# Patient Record
Sex: Female | Born: 1990 | Hispanic: Yes | Marital: Married | Smoking: Never smoker
Health system: Southern US, Community
[De-identification: ages and names within clinical notes are randomized; demographics above are authoritative.]

## PROBLEM LIST (undated history)

## (undated) DIAGNOSIS — Z3490 Encounter for supervision of normal pregnancy, unspecified, unspecified trimester: Principal | ICD-10-CM

## (undated) DIAGNOSIS — Z319 Encounter for procreative management, unspecified: Principal | ICD-10-CM

## (undated) DIAGNOSIS — Z789 Other specified health status: Secondary | ICD-10-CM

## (undated) DIAGNOSIS — Z1331 Encounter for screening for depression: Secondary | ICD-10-CM

## (undated) DIAGNOSIS — N912 Amenorrhea, unspecified: Principal | ICD-10-CM

## (undated) DIAGNOSIS — O019 Hydatidiform mole, unspecified: Secondary | ICD-10-CM

## (undated) DIAGNOSIS — Z309 Encounter for contraceptive management, unspecified: Secondary | ICD-10-CM

## (undated) DIAGNOSIS — Z8759 Personal history of other complications of pregnancy, childbirth and the puerperium: Secondary | ICD-10-CM

## (undated) HISTORY — DX: Amenorrhea, unspecified: N91.2

## (undated) HISTORY — DX: Encounter for procreative management, unspecified: Z31.9

## (undated) HISTORY — DX: Hydatidiform mole, unspecified: O01.9

## (undated) HISTORY — DX: Encounter for contraceptive management, unspecified: Z30.9

## (undated) HISTORY — DX: Encounter for screening for depression: Z13.31

## (undated) HISTORY — PX: NO PAST SURGERIES: SHX2092

## (undated) HISTORY — DX: Encounter for supervision of normal pregnancy, unspecified, unspecified trimester: Z34.90

## (undated) HISTORY — DX: Personal history of other complications of pregnancy, childbirth and the puerperium: Z87.59

---

## 2013-01-31 ENCOUNTER — Encounter: Payer: Self-pay | Admitting: Adult Health

## 2013-03-22 ENCOUNTER — Encounter: Payer: Self-pay | Admitting: *Deleted

## 2013-03-25 ENCOUNTER — Ambulatory Visit (INDEPENDENT_AMBULATORY_CARE_PROVIDER_SITE_OTHER): Payer: 59 | Admitting: Adult Health

## 2013-03-25 ENCOUNTER — Encounter: Payer: Self-pay | Admitting: Adult Health

## 2013-03-25 VITALS — BP 110/60 | Ht 63.0 in | Wt 115.0 lb

## 2013-03-25 DIAGNOSIS — N912 Amenorrhea, unspecified: Secondary | ICD-10-CM

## 2013-03-25 DIAGNOSIS — Z3202 Encounter for pregnancy test, result negative: Secondary | ICD-10-CM

## 2013-03-25 DIAGNOSIS — Z3200 Encounter for pregnancy test, result unknown: Secondary | ICD-10-CM

## 2013-03-25 HISTORY — DX: Amenorrhea, unspecified: N91.2

## 2013-03-25 MED ORDER — MEDROXYPROGESTERONE ACETATE 10 MG PO TABS: 10.0000 mg | ORAL_TABLET | Freq: Every day | ORAL | Status: DC

## 2013-03-25 MED ORDER — PRENATAL PLUS 27-1 MG PO TABS: 1.0000 | ORAL_TABLET | Freq: Every day | ORAL | Status: DC

## 2013-03-25 NOTE — Patient Instructions (Addendum)
Secondary Amenorrhea  Secondary amenorrhea is the stopping of menstrual flow for 3 to 6 months in a female who has previously had periods. There are many possible causes. Most of these causes are not serious. Usually treating the underlying problem causing the loss of menses will return your periods to normal. CAUSES  Some common and uncommon causes of not menstruating include:  Malnutrition.  Low blood sugar (hypoglycemia).  Polycystic ovarian disease.  Stress or fear.  Breastfeeding.  Hormone imbalance.  Ovarian failure.  Medications.  Extreme obesity.  Cystic fibrosis.  Low body weight or drastic weight reduction from any cause.  Early menopause.  Removal of ovaries or uterus.  Contraceptives.  Illness.  Long term (chronic) illnesses.  Cushing's syndrome.  Thyroid problems.  Birth control pills, patches, or vaginal rings for birth control. DIAGNOSIS  This diagnosis is made by your caregiver taking a medical history and doing a physical exam. Pregnancy must be ruled out. Often times, numerous blood tests of different hormones in the body may be measured. Urine testing may be done. Specialized x-rays may have to be done as well as measuring the body mass index (BMI). TREATMENT  Treatment depends on the cause of the amenorrhea. If an eating disorder is present, this can be treated with an adequate diet and therapy. Chronic illnesses may improve with treatment of the illness. Overall, the outlook is good. The amenorrhea may be corrected with medications, lifestyle changes, or surgery. If the amenorrhea cannot be corrected, it is sometimes possible to create a false menstruation with medications. Document Released: 10/24/2006 Document Revised: 12/05/2011 Document Reviewed: 08/31/2007 [facility] Patient Information 2014 [place], [place]. Take provera 10 mg x 10 days  Follow up in 21 days Take prenatal  vitamins

## 2013-03-25 NOTE — Progress Notes (Signed)
Subjective:     Patient ID: Maria [name], female   DOB: 1991-04-20, 22 y.o.   MRN: [id number]  HPI [name] is a 22 year old Hispanic female in complaining of no period x 3 months, it was normal til April.She stopped the pill in June of 2012.She and her husband want a baby.  Review of Systems Positives in HPI  Reviewed past medical,surgical, social and family history. Reviewed medications and allergies.     Objective:   Physical Exam BP 110/60  Ht 5\' 3"  (1.6 m)  Wt 115 lb (52.164 kg)  BMI 20.38 kg/m2  LMP 03/17/2013  Urine pregnancy test negative.Skin warm and dry. Neck: mid line trachea, normal thyroid. Discussed starting period with provera and if she does not start will get labs.     Assessment:     Amenorrhea Desires pregnancy    Plan:      Rx provera 10 mg #30 1 po x 10 days, no refills Rx prenatal plus #30 1 daily  Follow up  in 3 weeks, to see if she starts and will discuss options then

## 2013-04-15 ENCOUNTER — Encounter: Payer: Self-pay | Admitting: Adult Health

## 2013-04-15 ENCOUNTER — Ambulatory Visit (INDEPENDENT_AMBULATORY_CARE_PROVIDER_SITE_OTHER): Payer: 59 | Admitting: Adult Health

## 2013-04-15 VITALS — BP 120/76 | Ht 63.0 in | Wt 115.0 lb

## 2013-04-15 DIAGNOSIS — Z319 Encounter for procreative management, unspecified: Secondary | ICD-10-CM

## 2013-04-15 HISTORY — DX: Encounter for procreative management, unspecified: Z31.9

## 2013-04-15 NOTE — Assessment & Plan Note (Signed)
Period started after provera have sex every other night and check progesterone level 04/29/13

## 2013-04-15 NOTE — Progress Notes (Signed)
Subjective:     Patient ID: Maria [name], female   DOB: Jun 17, 1991, 22 y.o.   MRN: [id number]  HPI Maria [name] is back to discuss getting pregnant, her period started July 15 after taking provera.  Review of Systems See HPI Reviewed past medical,surgical, social and family history. Reviewed medications and allergies.     Objective:   Physical Exam BP 120/76  Ht 5\' 3"  (1.6 m)  Wt 115 lb (52.164 kg)  BMI 20.38 kg/m2  LMP 04/09/2013   talk only, to have sex every other night til 8/6 and come in 04/29/13 to get progesterone level drawn, continue prenatal vitamins. Husband with her.  Assessment:      Desires pregnancy    Plan:      Return August 4 for progesterone level Have sex every other night til 8/6 Take prenatal vitamins  Call any problems or questions

## 2013-04-15 NOTE — Patient Instructions (Addendum)
Return august 4 for labs Follow up labs by phone

## 2013-04-29 ENCOUNTER — Other Ambulatory Visit (INDEPENDENT_AMBULATORY_CARE_PROVIDER_SITE_OTHER): Payer: 59

## 2013-04-29 DIAGNOSIS — Z319 Encounter for procreative management, unspecified: Secondary | ICD-10-CM

## 2013-04-29 DIAGNOSIS — N912 Amenorrhea, unspecified: Secondary | ICD-10-CM

## 2013-04-30 ENCOUNTER — Telehealth: Payer: Self-pay | Admitting: Adult Health

## 2013-04-30 NOTE — Telephone Encounter (Signed)
Left message to call back regarding progesterone level

## 2014-08-20 ENCOUNTER — Other Ambulatory Visit: Payer: Self-pay | Admitting: Women's Health

## 2014-08-20 ENCOUNTER — Encounter: Payer: Self-pay | Admitting: Women's Health

## 2014-08-20 ENCOUNTER — Ambulatory Visit (INDEPENDENT_AMBULATORY_CARE_PROVIDER_SITE_OTHER): Payer: BC Managed Care – PPO | Admitting: Women's Health

## 2014-08-20 ENCOUNTER — Other Ambulatory Visit (HOSPITAL_COMMUNITY)
Admission: RE | Admit: 2014-08-20 | Discharge: 2014-08-20 | Disposition: A | Discharge status: Home / Self Care | Payer: BC Managed Care – PPO | Source: Ambulatory Visit | Attending: Obstetrics & Gynecology | Admitting: Obstetrics & Gynecology

## 2014-08-20 VITALS — BP 140/66 | Ht 60.5 in | Wt 109.0 lb

## 2014-08-20 DIAGNOSIS — N912 Amenorrhea, unspecified: Secondary | ICD-10-CM

## 2014-08-20 DIAGNOSIS — R52 Pain, unspecified: Secondary | ICD-10-CM

## 2014-08-20 DIAGNOSIS — N644 Mastodynia: Secondary | ICD-10-CM | POA: Insufficient documentation

## 2014-08-20 DIAGNOSIS — Z01419 Encounter for gynecological examination (general) (routine) without abnormal findings: Secondary | ICD-10-CM | POA: Insufficient documentation

## 2014-08-20 DIAGNOSIS — Z803 Family history of malignant neoplasm of breast: Secondary | ICD-10-CM

## 2014-08-20 DIAGNOSIS — Z3202 Encounter for pregnancy test, result negative: Secondary | ICD-10-CM

## 2014-08-20 DIAGNOSIS — N970 Female infertility associated with anovulation: Secondary | ICD-10-CM | POA: Insufficient documentation

## 2014-08-20 LAB — POCT URINE PREGNANCY: Preg Test, Ur: NEGATIVE

## 2014-08-20 MED ORDER — CLOMIPHENE CITRATE 50 MG PO TABS: 50.0000 mg | ORAL_TABLET | Freq: Every day | ORAL | Status: DC

## 2014-08-20 MED ORDER — MEDROXYPROGESTERONE ACETATE 10 MG PO TABS: 10.0000 mg | ORAL_TABLET | Freq: Every day | ORAL | Status: DC

## 2014-08-20 MED ORDER — CONCEPT DHA 53.5-38-1 MG PO CAPS: 1.0000 | ORAL_CAPSULE | Freq: Every day | ORAL | Status: DC

## 2014-08-20 NOTE — Patient Instructions (Signed)
Take Provera daily for 10 days then stop, you should have some bleeding when you stop (the first day you see blood is considered Day 1)- if you do not- then call us and let us know Take Clomid on days 3-7, then stop Have sex every other day on days 7-24 Pee before sex, then lay with your hips propped up on pillows for 20-30 minutes after sex Begin taking a prenatal vitamin

## 2014-08-20 NOTE — Progress Notes (Signed)
Patient ID: Maria [name], female   DOB: 03/21/1991, 23 y.o.   MRN: [id number]  Subjective:   Maria [name] is a 23 y.o. G0P0 Hispanic female here for a routine well-woman exam.  Patient's last menstrual period was 04/05/2014.    Current complaints: Very irregular periods- last period in July, trying to get pregnant unsuccessfully for ~1.1534yrs, saw JAG 02/2013 for same, was given provera and did have a period, progesterone was checked and suggested pt did not ovulate, JAG was going to offer clomid, but pt did not return call.  Lt breast pain x 1 month, constant at first, now just intermittent, under Lt axilla down left side of left breast and some on Rt side of left breast. Mom was dx w/ breast CA at 36yo.  No h/o HTN, states she's just a little nervous about being here PCP: Robbie [place], has appt in Dec for physical       Does desire labs  Social History: Sexual: heterosexual Marital Status: married Living situation: with spouse Tobacco/alcohol: no etoh or tobacco Illicit drugs: no history of illicit drug use  The following portions of the patient's history were reviewed and updated as appropriate: allergies, current medications, past family history, past medical history, past social history, past surgical history and problem list.  Past Medical History Past Medical History  Diagnosis Date  . Amenorrhea 03/25/2013  . Patient desires pregnancy 04/15/2013    Past Surgical History History reviewed. No pertinent past surgical history.  Gynecologic History No obstetric history on file.  Patient's last menstrual period was 04/05/2014. Contraception: none, trying to conceive Last Pap: unsure. Results were: normal Last mammogram: never. Results were: n/a Last TCS: never  Obstetric History OB History  No data available    Current Medications Current Outpatient Prescriptions on File Prior to Visit  Medication Sig Dispense Refill  . Multiple Vitamins-Minerals (MULTIVITAMIN WITH MINERALS)  tablet Take 1 tablet by mouth daily.    . prenatal vitamin w/FE, FA (PRENATAL 1 + 1) 27-1 MG TABS Take 1 tablet by mouth daily at 12 noon. (Patient not taking: Reported on 08/20/2014) 30 each 0   No current facility-administered medications on file prior to visit.    Review of Systems Patient denies any headaches, blurred vision, shortness of breath, chest pain, abdominal pain, problems with bowel movements, urination, or intercourse.  Objective:  BP 140/66 mmHg  Ht 5' 0.5" (1.537 m)  Wt 109 lb (49.442 kg)  BMI 20.93 kg/m2  LMP 04/05/2014 Physical Exam  General:  Well developed, well nourished, no acute distress. She is alert and oriented x3. Skin:  Warm and dry Neck:  Midline trachea, no thyromegaly or nodules Cardiovascular: Regular rate and rhythm, no murmur heard Lungs:  Effort normal, all lung fields clear to auscultation bilaterally Breasts:  No dominant palpable mass, retraction, or nipple discharge Abdomen:  Soft, non tender, no hepatosplenomegaly or masses Pelvic:  External genitalia is normal in appearance.  The vagina is normal in appearance. The cervix is bulbous, no CMT.  Thin prep pap is done w/ reflex HR HPV cotesting. Uterus is felt to be normal size, shape, and contour.  No adnexal masses or tenderness noted. Extremities:  No swelling or varicosities noted Psych:  She has a normal mood and affect  Assessment:   Healthy well-woman exam Infertility r/t anovulation Lt breast mastalgia H/O mom dx w/ breast CA @ 35yo  Plan:  Will get breast u/s (spoke to radiology, will not do mammo 1st <30yo): scheduled for 12/1 @ 2pm @  [facility] Rx concept dha pnv to begin daily Rx provera 10mg  take 1 daily x 10d, let us know if no period/bleeding when stops Begin clomid 50mg  daily on days 3-7 Have sex QOD on days 7-24 Pee before sex, lay w/ hips elevated x 20-4130mins after sex before getting up F/U 3 months for f/u, or sooner if needed Colonoscopy @ 24yo or sooner if  problems  Marge DuncansBooker, Keaghan Staton Randall CNM, [facility] 08/20/2014 4:03 PM

## 2014-08-21 LAB — COMPREHENSIVE METABOLIC PANEL
ALBUMIN: 4.5 g/dL (ref 3.5–5.2)
ALK PHOS: 56 U/L (ref 39–117)
ALT: 16 U/L (ref 0–35)
AST: 20 U/L (ref 0–37)
BILIRUBIN TOTAL: 0.5 mg/dL (ref 0.2–1.2)
BUN: 8 mg/dL (ref 6–23)
CO2: 23 mEq/L (ref 19–32)
CREATININE: 0.6 mg/dL (ref 0.50–1.10)
Calcium: 9.4 mg/dL (ref 8.4–10.5)
Chloride: 103 mEq/L (ref 96–112)
GLUCOSE: 93 mg/dL (ref 70–99)
Potassium: 3.9 mEq/L (ref 3.5–5.3)
Sodium: 140 mEq/L (ref 135–145)
Total Protein: 7.4 g/dL (ref 6.0–8.3)

## 2014-08-21 LAB — CBC
HEMATOCRIT: 40 % (ref 36.0–46.0)
Hemoglobin: 13.8 g/dL (ref 12.0–15.0)
MCH: 29.7 pg (ref 26.0–34.0)
MCHC: 34.5 g/dL (ref 30.0–36.0)
MCV: 86.2 fL (ref 78.0–100.0)
MPV: 9.5 fL (ref 9.4–12.4)
PLATELETS: 328 10*3/uL (ref 150–400)
RBC: 4.64 MIL/uL (ref 3.87–5.11)
RDW: 13.5 % (ref 11.5–15.5)
WBC: 6.6 10*3/uL (ref 4.0–10.5)

## 2014-08-21 LAB — TSH: TSH: 1.38 u[IU]/mL (ref 0.350–4.500)

## 2014-08-25 ENCOUNTER — Other Ambulatory Visit: Payer: Self-pay | Admitting: Women's Health

## 2014-08-25 ENCOUNTER — Telehealth: Payer: Self-pay | Admitting: Women's Health

## 2014-08-25 DIAGNOSIS — N644 Mastodynia: Secondary | ICD-10-CM

## 2014-08-25 NOTE — Telephone Encounter (Signed)
Notified pt of breast u/s appt at [facility] tomorrow 12/1 @ 1400, be there at 1345, no deoderant/lotion/powder.  Cheral MarkerKimberly R. Cady Hafen, CNM, [facility]NP-BC 08/25/2014 2:14 PM

## 2014-08-26 ENCOUNTER — Ambulatory Visit (HOSPITAL_COMMUNITY)
Admission: RE | Admit: 2014-08-26 | Discharge: 2014-08-26 | Disposition: A | Discharge status: Home / Self Care | Payer: BC Managed Care – PPO | Source: Ambulatory Visit | Attending: Women's Health | Admitting: Women's Health

## 2014-08-26 ENCOUNTER — Other Ambulatory Visit: Payer: Self-pay | Admitting: Women's Health

## 2014-08-26 DIAGNOSIS — N644 Mastodynia: Secondary | ICD-10-CM | POA: Diagnosis not present

## 2014-08-26 DIAGNOSIS — Z803 Family history of malignant neoplasm of breast: Secondary | ICD-10-CM | POA: Insufficient documentation

## 2014-08-26 DIAGNOSIS — R52 Pain, unspecified: Secondary | ICD-10-CM

## 2014-08-26 LAB — CYTOLOGY - PAP

## 2014-09-11 ENCOUNTER — Telehealth: Payer: Self-pay | Admitting: Adult Health

## 2014-09-11 NOTE — Telephone Encounter (Signed)
Pt states she has taken clomid from day 3-7 and is now bleeding heavily, is she suppose to have sex while bleeding heavily. Per Cyril MourningJennifer Griffin, NP, she can wait 3-4 days if more comfortable but is ok and safe to have sex now. Pt verbalized understanding.

## 2014-10-14 ENCOUNTER — Telehealth: Payer: Self-pay | Admitting: Women's Health

## 2014-10-14 NOTE — Telephone Encounter (Signed)
Spoke with pt. Pt states she had no period for Jan. She has had a negative pregnancy test. She was prescribed Clomid in November. Please advise. Thanks!! JSY

## 2014-10-14 NOTE — Telephone Encounter (Signed)
Left message x 1. JSY 

## 2014-10-14 NOTE — Telephone Encounter (Signed)
Maria BattenKim and [name] spoke with pt. Pt to start Clomid today x 5 days and have sex every other day when she stops Clomid. To have Progesterone level on 2/9. JSY

## 2014-11-04 ENCOUNTER — Other Ambulatory Visit: Payer: BLUE CROSS/BLUE SHIELD

## 2014-11-04 DIAGNOSIS — N970 Female infertility associated with anovulation: Secondary | ICD-10-CM

## 2014-11-05 ENCOUNTER — Other Ambulatory Visit: Payer: Self-pay | Admitting: Women's Health

## 2014-11-05 ENCOUNTER — Telehealth: Payer: Self-pay | Admitting: Women's Health

## 2014-11-05 DIAGNOSIS — Z319 Encounter for procreative management, unspecified: Secondary | ICD-10-CM

## 2014-11-05 LAB — PROGESTERONE: Progesterone: 0.8 ng/mL

## 2014-11-05 NOTE — Telephone Encounter (Signed)
Called to notify pt of progesterone 0.8 on 2/9. Had called on 1/19 and stated she hadn't had period for January- so recommended beginning the clomid x 5d then and come in 21d later for progesterone- which was on 2/9. Pt states she did end up having a period from 1/26-2/1 and another round of clomid on days 3-7, so really should be ovulating around now. Discussed w/ JAG. Will recheck progesterone 21d after start of last period, will be on 2/16. If progesterone not suggestive of ovulation, will increase clomid to 100mg . Used [name] as an interpreter w/ pt's permission.  Cheral MarkerKimberly R. Booker, CNM, [facility] 11/05/2014 1:58 PM

## 2014-11-11 ENCOUNTER — Other Ambulatory Visit: Payer: BLUE CROSS/BLUE SHIELD

## 2014-11-11 DIAGNOSIS — Z319 Encounter for procreative management, unspecified: Secondary | ICD-10-CM

## 2014-11-12 LAB — PROGESTERONE: Progesterone: 1.7 ng/mL

## 2014-11-17 ENCOUNTER — Telehealth: Payer: Self-pay | Admitting: Women's Health

## 2014-11-17 NOTE — Telephone Encounter (Signed)
Left message to return call, to discuss progesterone results.  Cheral MarkerKimberly R. Yeily Link, CNM, [facility]NP-BC 11/17/2014 4:57 PM

## 2014-11-18 ENCOUNTER — Telehealth: Payer: Self-pay | Admitting: Women's Health

## 2014-11-18 NOTE — Telephone Encounter (Signed)
Pt returned call, notified her she did not ovulate, has been on clomid 50mg  x 3 cycles now- to increase clomid to 100mg  on days 3-7 x 3 months.  To call back in May when period starts if she is not pregnant before then, will recheck progesterone level then on day 21. Understands that increases in clomid can increase r/f multiple gestation- she is ok w/ this. Used [name] for interpreter with pt's permission.  Cheral MarkerKimberly R. Manju Kulkarni, CNM, [facility]NP[facility]BC 11/18/2014 5:07 PM

## 2014-11-19 ENCOUNTER — Ambulatory Visit: Payer: BC Managed Care – PPO | Admitting: Women's Health

## 2014-11-21 ENCOUNTER — Other Ambulatory Visit: Payer: Self-pay | Admitting: *Deleted

## 2014-11-22 ENCOUNTER — Other Ambulatory Visit: Payer: Self-pay | Admitting: Women's Health

## 2014-11-24 ENCOUNTER — Ambulatory Visit: Payer: BC Managed Care – PPO | Admitting: Women's Health

## 2014-11-24 ENCOUNTER — Telehealth: Payer: Self-pay | Admitting: Women's Health

## 2014-11-24 ENCOUNTER — Other Ambulatory Visit: Payer: Self-pay | Admitting: Adult Health

## 2014-11-24 MED ORDER — CLOMIPHENE CITRATE 50 MG PO TABS: ORAL_TABLET | ORAL | Status: DC

## 2014-11-24 NOTE — Telephone Encounter (Signed)
Pt requesting refill on Clomid 100 mg for infertility. Pt states Joellyn HaffKim Booker, CNM increase Clomid from 50 to 100 mg for the next 3 mos but pt does not have any refills.

## 2014-11-24 NOTE — Telephone Encounter (Signed)
Refilled clomid 

## 2014-11-28 NOTE — Telephone Encounter (Signed)
Kim refilled the Clomid per pt request.

## 2015-01-14 ENCOUNTER — Telehealth: Payer: Self-pay | Admitting: Women's Health

## 2015-01-14 MED ORDER — CLOMIPHENE CITRATE 50 MG PO TABS: ORAL_TABLET | ORAL | Status: DC

## 2015-01-15 ENCOUNTER — Other Ambulatory Visit: Payer: Self-pay | Admitting: Women's Health

## 2015-02-02 ENCOUNTER — Ambulatory Visit (INDEPENDENT_AMBULATORY_CARE_PROVIDER_SITE_OTHER): Payer: BLUE CROSS/BLUE SHIELD | Admitting: Obstetrics and Gynecology

## 2015-02-02 ENCOUNTER — Encounter: Payer: Self-pay | Admitting: Obstetrics and Gynecology

## 2015-02-02 VITALS — BP 130/80 | Ht 63.0 in | Wt 112.0 lb

## 2015-02-02 DIAGNOSIS — N926 Irregular menstruation, unspecified: Secondary | ICD-10-CM

## 2015-02-02 DIAGNOSIS — N970 Female infertility associated with anovulation: Secondary | ICD-10-CM

## 2015-02-02 MED ORDER — CLOMIPHENE CITRATE 50 MG PO TABS: 200.0000 mg | ORAL_TABLET | Freq: Every day | ORAL | Status: DC

## 2015-02-02 NOTE — Progress Notes (Signed)
Patient ID: Maria [name], female   DOB: 03/21/1991, 24 y.o.   MRN: [id number]    [facility]  Patient name: Maria [name] MRN [id number]  Date of birth: 03/21/1991  CC & HPI:  Maria [name] is a 24 y.o. female with a history of amenorrhea presenting today for difficulty conceiving. Pt reports that she has been trying to become pregnant for over two years with no success. She has a history of irregular menstrual cycles, but reports that regularity improves with Clomid. Pt takes Clomid-100 mg starting on day 3 of her cycle. She reports normal cycles of 21-27 days.  ROS:  A complete 10 system review of systems was obtained and all systems are negative except as noted in the HPI and PMH.   Pertinent History Reviewed:   Reviewed: Significant for amenorrhea Medical         Past Medical History  Diagnosis Date  . Amenorrhea 03/25/2013  . Patient desires pregnancy 04/15/2013                              Surgical Hx:   History reviewed. No pertinent past surgical history. Medications: Reviewed & Updated - see associated section                       Current outpatient prescriptions:  .  Prenat-FeFum-FePo-FA-Omega 3 (CONCEPT DHA) 53.5-38-1 MG CAPS, Take 1 capsule by mouth daily., Disp: 30 capsule, Rfl: 11 .  clomiPHENE (CLOMID) 50 MG tablet, Take 2 tabs days 3-7 of cycle (Patient not taking: Reported on 02/02/2015), Disp: 6 tablet, Rfl: 0 .  medroxyPROGESTERone (PROVERA) 10 MG tablet, Take 1 tablet (10 mg total) by mouth daily. (Patient not taking: Reported on 02/02/2015), Disp: 10 tablet, Rfl: 3   Social History: Reviewed -  reports that she has never smoked. She has never used smokeless tobacco.  Objective Findings:  Vitals: Blood pressure 130/80, height 5\' 3"  (1.6 m), weight 112 lb (50.803 kg).  Physical Examination: General appearance - alert, well appearing, and in no distress and oriented to person, place, and time Mental status - alert, oriented to person, place, and time,  normal mood, behavior, speech, dress, motor activity, and thought processes  Discussion only   Assessment & Plan:   A:  1. History of amenorrhea 2. Difficulty conceiving 3. Pt takes Clomid-100 mg every day starting 3rd day of cycle  P:  1. Measure progesterone level in 2 days 2. Will follow-up with plan, including possible progesterone suppository, pending Progesterone levels on 5/11 3. Will prescribe Clomid-200 mg for 5 days. Pt needs help with copay. She will continue taking 100 mg/d  4. Follow-up in 10 days during pt's menses   This chart was scribed for Tilda BurrowJohn Gayatri Teasdale V, MD by Gwenyth Oberatherine Macek, ED Scribe. This patient was seen in conference room and the patient's care was started at 4:52 PM.   I personally performed the services described in this documentation, which was SCRIBED in my presence. The recorded information has been reviewed and considered accurate. It has been edited as necessary during review. Tilda BurrowFERGUSON,Marielis Samara V, MD

## 2015-02-02 NOTE — Progress Notes (Signed)
Patient ID: Maria [name], female   DOB: 03/21/1991, 24 y.o.   MRN: [id number] Pt here today for follow up from her last visit. Pt denies any problems or concerns at this time.

## 2015-02-04 ENCOUNTER — Other Ambulatory Visit: Payer: BLUE CROSS/BLUE SHIELD

## 2015-02-04 ENCOUNTER — Other Ambulatory Visit: Payer: Self-pay | Admitting: Obstetrics and Gynecology

## 2015-02-05 LAB — PROGESTERONE: PROGESTERONE: 22.3 ng/mL

## 2015-02-06 ENCOUNTER — Telehealth: Payer: Self-pay | Admitting: Obstetrics and Gynecology

## 2015-02-06 ENCOUNTER — Telehealth: Payer: Self-pay | Admitting: *Deleted

## 2015-02-06 NOTE — Telephone Encounter (Signed)
Pt progesterone levels ovulatory,  Left message for pt ; she does not need to take progesterone tabs.

## 2015-02-06 NOTE — Telephone Encounter (Signed)
Pt aware of results. Pt was also advised to have sex and call us back[place] if she has a period next month and do not take the clomid right now per ColgateJennifer Griffin. Pt verbalized understanding.

## 2015-02-12 ENCOUNTER — Ambulatory Visit (INDEPENDENT_AMBULATORY_CARE_PROVIDER_SITE_OTHER): Payer: BLUE CROSS/BLUE SHIELD | Admitting: Obstetrics and Gynecology

## 2015-02-12 ENCOUNTER — Encounter: Payer: Self-pay | Admitting: Obstetrics and Gynecology

## 2015-02-12 VITALS — BP 110/60 | Wt 111.5 lb

## 2015-02-12 DIAGNOSIS — N926 Irregular menstruation, unspecified: Secondary | ICD-10-CM

## 2015-02-12 DIAGNOSIS — N970 Female infertility associated with anovulation: Secondary | ICD-10-CM

## 2015-02-12 NOTE — Progress Notes (Signed)
Patient ID: Maria [name], female   DOB: 03/21/1991, 24 y.o.   MRN: [id number] Pt here today for follow up visit.

## 2015-02-12 NOTE — Progress Notes (Signed)
Patient ID: Maria EvenerDulce Buskirk, female   DOB: 03/21/1991, 24 y.o.   MRN: [id number]   [facility]  Patient name: Maria [name] MRN [id number]  Date of birth: 03/21/1991  CC & HPI:  Maria [name] is a 24 y.o. female presenting today for follow up of infertility associated with anovulation. She states she has not had a period this month; her menses are 1 day late.  ROS:  A complete review of systems was obtained and all systems are negative except as noted in the HPI and PMH.    Pertinent History Reviewed:   Reviewed: Significant for infertility associated  Medical         Past Medical History  Diagnosis Date  . Amenorrhea 03/25/2013  . Patient desires pregnancy 04/15/2013                              Surgical Hx:   History reviewed. No pertinent past surgical history. Medications: Reviewed & Updated - see associated section                       Current outpatient prescriptions:  .  clomiPHENE (CLOMID) 50 MG tablet, Take 4 tablets (200 mg total) by mouth daily. Take  4 tabs days 3-7 of cycle, Disp: 20 tablet, Rfl: 0 .  medroxyPROGESTERone (PROVERA) 10 MG tablet, Take 1 tablet (10 mg total) by mouth daily. (Patient not taking: Reported on 02/02/2015), Disp: 10 tablet, Rfl: 3 .  Prenat-FeFum-FePo-FA-Omega 3 (CONCEPT DHA) 53.5-38-1 MG CAPS, Take 1 capsule by mouth daily., Disp: 30 capsule, Rfl: 11   Social History: Reviewed -  reports that she has never smoked. She has never used smokeless tobacco.  Objective Findings:  Vitals: Blood pressure 110/60, weight 111 lb 8 oz (50.576 kg).  Physical Examination:  Patient here for discussion only.  Now on day 29. No menses yet.  Assessment & Plan:   A:  1. Pt's labs show that she is ovulating.  P:  1. Instructed pt take a home pregnancy test in 4 days. 2. If she does have a period, continue Clomid 100 mg days 3-7 3. F/u in 1 month.   This chart was SCRIBED for Christin BachJohn Tyshana Nishida, MD by Ronney LionSuzanne Le, ED Scribe. This patient was seen in  my office, and the patient's care was started at 4:12 PM.   I personally performed the services described in this documentation, which was SCRIBED in my presence. The recorded information has been reviewed and considered accurate. It has been edited as necessary during review. Tilda BurrowFERGUSON,Dacotah Cabello V, MD

## 2015-04-01 ENCOUNTER — Other Ambulatory Visit: Payer: Self-pay | Admitting: Obstetrics and Gynecology

## 2015-05-05 ENCOUNTER — Telehealth: Payer: Self-pay | Admitting: *Deleted

## 2015-05-11 NOTE — Telephone Encounter (Signed)
appt

## 2015-06-09 ENCOUNTER — Ambulatory Visit (INDEPENDENT_AMBULATORY_CARE_PROVIDER_SITE_OTHER): Payer: BLUE CROSS/BLUE SHIELD | Admitting: Obstetrics and Gynecology

## 2015-06-09 ENCOUNTER — Encounter: Payer: Self-pay | Admitting: Obstetrics and Gynecology

## 2015-06-09 VITALS — BP 120/76 | Ht 63.0 in | Wt 113.0 lb

## 2015-06-09 DIAGNOSIS — N970 Female infertility associated with anovulation: Secondary | ICD-10-CM | POA: Diagnosis not present

## 2015-06-09 MED ORDER — CLOMIPHENE CITRATE 50 MG PO TABS: ORAL_TABLET | ORAL | Status: DC

## 2015-06-09 NOTE — Progress Notes (Signed)
Patient ID: Maria [name], female   DOB: Dec 12, 1990, 24 y.o.   MRN: [id number]   [facility] Visit  Patient name: Maria [name] MRN [id number]  Date of birth: Feb 09, 1991  CC & HPI:  Maria [name] is a 24 y.o. female presenting today for followup infertility. lmp 9/12 light x 1 day on cycle day 36 , none today.pt took clomid on days 3-7 of cycle, on 10-14 August.. PMP is July 13-18, and pt took pills on 15-19 July. Pt is using a fertility app, also a calendar. Pt NOT taking ovulation guide but fertility app is guiding coitus.   ROS:  Not taking temperature.   Pertinent History Reviewed:   Reviewed: Significant for negative ROS Medical         Past Medical History  Diagnosis Date  . Amenorrhea 03/25/2013  . Patient desires pregnancy 04/15/2013                              Surgical Hx:   History reviewed. No pertinent past surgical history. Medications: Reviewed & Updated - see associated section                       Current outpatient prescriptions:  .  Prenat-FeFum-FePo-FA-Omega 3 (CONCEPT DHA) 53.5-38-1 MG CAPS, Take 1 capsule by mouth daily., Disp: 30 capsule, Rfl: 11 .  clomiPHENE (CLOMID) 50 MG tablet, TAKE 4 TABLETS BY MOUTH ON DAYS 3 TO 7 OF CYCLE. (Patient not taking: Reported on 06/09/2015), Disp: 20 tablet, Rfl: 0 .  medroxyPROGESTERone (PROVERA) 10 MG tablet, Take 1 tablet (10 mg total) by mouth daily. (Patient not taking: Reported on 02/02/2015), Disp: 10 tablet, Rfl: 3   Social History: Reviewed -  reports that she has never smoked. She has never used smokeless tobacco.  Objective Findings:  Vitals: Blood pressure 120/76, height  (1.6 m), weight 113 lb (51.256 kg).  Physical Examination: General appearance - alert, well appearing, and in no distress, oriented to person, place, and time and normal appearing weight Mental status - alert, oriented to person, place, and time, normal mood, behavior, speech, dress, motor activity, and thought processes Eyes -  pupils equal and reactive, extraocular eye movements intact   Assessment & Plan:   A:  1. INfertility. 2.   P:  1. Renew clomid 100 mg days 3-7. 2.

## 2015-06-09 NOTE — Progress Notes (Signed)
Patient ID: Maria [name], female   DOB: 22-May-1991, 24 y.o.   MRN: [id number] Pt here today for follow up. Pt not taking clomid at this time.

## 2015-06-15 ENCOUNTER — Ambulatory Visit (INDEPENDENT_AMBULATORY_CARE_PROVIDER_SITE_OTHER): Payer: BLUE CROSS/BLUE SHIELD | Admitting: Adult Health

## 2015-06-15 ENCOUNTER — Encounter: Payer: Self-pay | Admitting: Adult Health

## 2015-06-15 VITALS — BP 130/60 | HR 116 | Ht 63.0 in | Wt 111.5 lb

## 2015-06-15 DIAGNOSIS — O3680X0 Pregnancy with inconclusive fetal viability, not applicable or unspecified: Secondary | ICD-10-CM

## 2015-06-15 DIAGNOSIS — Z3201 Encounter for pregnancy test, result positive: Secondary | ICD-10-CM | POA: Diagnosis not present

## 2015-06-15 DIAGNOSIS — N925 Other specified irregular menstruation: Secondary | ICD-10-CM

## 2015-06-15 DIAGNOSIS — Z3490 Encounter for supervision of normal pregnancy, unspecified, unspecified trimester: Secondary | ICD-10-CM

## 2015-06-15 HISTORY — DX: Encounter for supervision of normal pregnancy, unspecified, unspecified trimester: Z34.90

## 2015-06-15 LAB — POCT URINE PREGNANCY: Preg Test, Ur: POSITIVE — AB

## 2015-06-15 NOTE — Progress Notes (Signed)
Subjective:     Patient ID: Maria [name], female   DOB: 07-16-1991, 24 y.o.   MRN: [id number]  HPI Maria [name] is a 24 year old Hispanic female, married in for UPT, has missed period, has breast tenderness and pees more often, has wanted to be pregnant for a while now and had tired clomid.  Review of Systems Patient denies any headaches, hearing loss, fatigue, blurred vision, shortness of breath, chest pain, abdominal pain, problems with bowel movements, urination, or intercourse. No joint pain or mood swings.See HPI for positives.  Reviewed past medical,surgical, social and family history. Reviewed medications and allergies.     Objective:   Physical Exam BP 130/60 mmHg  Pulse 116  Ht  (1.6 m)  Wt 111 lb 8 oz (50.576 kg)  BMI 19.76 kg/m2  LMP 05/04/2015 UPT +, about 6 weeks by LMP with EDD 02/08/16, Skin warm and dry. Neck: mid line trachea, normal thyroid, good ROM, no lymphadenopathy noted. Lungs: clear to ausculation bilaterally. Cardiovascular: regular rate and rhythm.    Assessment:     Pregnant     Plan:     Check QHCG and progesterone Return in 1 week for dating [place]   Review handout on first trimester

## 2015-06-15 NOTE — Patient Instructions (Signed)
Primer trimestre de Media planner (First Trimester of Pregnancy) El primer trimestre de Media planner se extiende desde la semana1 hasta el final de la semana12 (mes1 al mes3). Una semana despus de que un espermatozoide fecunda un vulo, este se implantar en la pared uterina. Este embrin comenzar a Medical laboratory scientific officer convertirse en un beb. Sus genes y los de su pareja forman el beb. Los genes del varn determinan si ser un nio o una nia. Entre la semana6 y [place], se forman los ojos y [place], y los latidos del corazn pueden verse en la ecografa. Al final de las 12semanas, todos los rganos del beb estn formados.  Ahora que est embarazada, querr hacer todo lo que est a su alcance para tener un beb sano. Dos de las cosas ms importantes son Lucilla Edin buena atencin prenatal y seguir las indicaciones del mdico. La atencin prenatal incluye toda la asistencia mdica que usted recibe antes del nacimiento del beb. Esta ayudar a prevenir, Hydrographic surveyor y tratar cualquier problema durante el embarazo y [place]. CAMBIOS EN EL ORGANISMO Su organismo atraviesa por muchos cambios durante el [place], y estos varan de Ardelia Mems mujer a Theatre manager.   Al principio, puede aumentar o bajar algunos kilos.  Puede tener Higher education careers adviser (nuseas) y vomitar. Si no puede controlar los vmitos, llame al mdico.  Puede cansarse con facilidad.  Es posible que tenga dolores de cabeza que pueden aliviarse con los medicamentos que el mdico le permita tomar.  Puede orinar con mayor frecuencia. El dolor al orinar puede significar que usted tiene una infeccin de la vejiga.  Debido al Glennis Brink, puede tener acidez estomacal.  Puede estar estreida, ya que ciertas hormonas enlentecen los movimientos de los msculos que [employer] desechos a travs de los intestinos.  Pueden aparecer hemorroides o abultarse e hincharse las venas (venas varicosas).  Las [employer] pueden empezar a Engineer, site y Scientist, forensic. Los pezones  pueden sobresalir ms, y el tejido que los rodea (areola) tornarse ms oscuro.  Las Production manager y estar sensibles al cepillado y al hilo dental.  Pueden aparecer zonas oscuras o manchas (cloasma, mscara del Media planner) en el rostro que probablemente se atenuarn despus del nacimiento del beb.  Los perodos menstruales se interrumpirn.  Tal vez no tenga apetito.  Puede sentir un fuerte deseo de consumir ciertos alimentos.  Puede tener cambios a Engineer, site a da, por ejemplo, por momentos puede estar emocionada por el Media planner y por otros preocuparse porque algo pueda salir mal con el embarazo o el beb.  Tendr sueos ms vvidos y extraos.  Tal vez haya cambios en el cabello que pueden incluir su engrosamiento, crecimiento rpido y cambios en la textura. A algunas mujeres tambin se les cae el cabello durante o despus del [place], o tienen el cabello seco o fino. Lo ms probable es que el cabello se le normalice despus del nacimiento del beb. QU DEBE ESPERAR EN LAS CONSULTAS PRENATALES Durante una visita prenatal de rutina:  La pesarn para asegurarse de que usted y el beb estn creciendo normalmente.  Le controlarn la presin arterial.  Le medirn el abdomen para controlar el desarrollo del beb.  Se escucharn los latidos cardacos a partir de la semana10 o la12 de embarazo, aproximadamente.  Se analizarn los resultados de los estudios solicitados en visitas anteriores. El mdico puede preguntarle:  Cmo se siente.  Si siente los movimientos del beb.  Si ha tenido [employer], como prdida de lquido, [place], dolores de cabeza intensos o  clicos abdominales.  Si tiene alguna pregunta. Otros estudios que pueden realizarse durante el primer trimestre incluyen lo siguiente:  Anlisis de sangre para determinar el tipo de sangre y detectar la presencia de infecciones previas. Adems, se los usar para controlar si los niveles de hierro  son bajos (anemia) y determinar los anticuerpos Rh. En una etapa ms avanzada del embarazo, se harn anlisis de sangre para saber si tiene diabetes, junto con otros estudios si surgen problemas.  Anlisis de orina para detectar infecciones, diabetes o protenas en la orina.  Una ecografa para confirmar que el beb crece y se desarrolla correctamente.  Una amniocentesis para diagnosticar posibles problemas genticos.  Estudios del feto para descartar espina bfida y sndrome de [place].  Es posible que necesite otras pruebas adicionales. INSTRUCCIONES PARA EL CUIDADO EN EL HOGAR  Medicamentos:  Siga las indicaciones del mdico en relacin con el uso de medicamentos. Durante el embarazo, hay medicamentos que pueden tomarse y otros que no.  Tome las vitaminas prenatales como se le indic.  Si est estreida, tome un laxante suave, si el mdico lo autoriza. Dieta  Consuma alimentos balanceados. Elija alimentos variados, como carne o protenas de origen vegetal, pescado, leche y productos lcteos descremados, verduras, frutas y panes y cereales integrales. El mdico la ayudar a determinar la cantidad de peso que puede aumentar.  No coma carne cruda ni quesos sin cocinar. Estos elementos contienen bacterias que pueden causar defectos congnitos en el beb.  La ingesta diaria de cuatro o cinco comidas pequeas en lugar de tres comidas abundantes puede ayudar a aliviar las nuseas y los vmitos. Si empieza a tener nuseas, comer algunas galletas saladas puede ser de ayuda. Beber lquidos entre las comidas en lugar de tomarlos durante las comidas tambin puede ayudar a calmar las nuseas y los vmitos.  Si est estreida, consuma alimentos con alto contenido de fibra, como verduras y frutas frescas, y cereales integrales. Beba suficiente lquido para mantener la orina clara o de color amarillo plido. Actividad y ejercicios  Haga ejercicio solamente como se lo haya indicado el mdico. El  ejercicio la ayudar a:  Controlar el peso.  Mantenerse en forma.  Estar preparada para el trabajo de parto y el parto.  Los dolores, los clicos en la parte baja del abdomen o los calambres en la cintura son un buen indicio de que debe dejar de hacer ejercicios. Consulte al mdico antes de seguir haciendo ejercicios normales.  Intente no estar de pie durante mucho tiempo. Mueva las piernas con frecuencia si debe estar de pie en un lugar durante mucho tiempo.  Evite levantar pesos excesivos.  Use zapatos de tacones bajos y mantenga una buena postura.  Puede seguir teniendo relaciones sexuales, excepto que el mdico le indique lo contrario. Alivio del dolor o las molestias  Use un sostn que le brinde buen soporte si siente dolor a la palpacin en las mamas.  Dese baos de asiento con agua tibia para aliviar el dolor o las molestias causadas por las hemorroides. Use crema antihemorroidal si el mdico se lo permite.  Descanse con las piernas elevadas si tiene calambres o dolor de cintura.  Si tiene venas varicosas en las piernas, use medias de descanso. Eleve los pies durante 15minutos, 3 o 4veces por da. Limite la cantidad de sal en su dieta. Cuidados prenatales  Programe las visitas prenatales para la semana12 de embarazo. Generalmente se programan cada mes al principio y se hacen ms frecuentes en los 2 ltimos meses antes   del parto.  Escriba sus preguntas. Llvelas cuando concurra a las visitas prenatales.  Concurra a todas las visitas prenatales como se lo haya indicado el mdico. Seguridad  Colquese el cinturn de seguridad cuando conduzca.  Haga una lista de los nmeros de telfono de Associate Professor, que [employer] nmeros de telfono de familiares, [place], el hospital y los departamentos de polica y bomberos. Consejos generales  Pdale al mdico que la derive a clases de educacin prenatal en su localidad. Debe comenzar a tomar las clases antes de Cytogeneticist en el mes6  de embarazo.  Pida ayuda si tiene necesidades nutricionales o de asesoramiento Academic librarian. El mdico puede aconsejarla o derivarla a especialistas para que la ayuden con diferentes necesidades.  No se d baos de inmersin en agua caliente, baos turcos ni saunas.  No se haga duchas vaginales ni use tampones o toallas higinicas perfumadas.  No mantenga las piernas cruzadas durante [place].  Evite el contacto con las bandejas sanitarias de los gatos y la tierra que estos animales usan. Estos elementos contienen bacterias que pueden causar defectos congnitos al beb y la posible prdida del feto debido a un aborto espontneo o muerte fetal.  No fume, no consuma hierbas ni medicamentos que no hayan sido recetados por el mdico. Las sustancias qumicas que estos productos contienen afectan la formacin y el desarrollo del beb.  Programe una cita con el dentista. En su casa, lvese los dientes con un cepillo dental blando y psese el hilo dental con suavidad. SOLICITE ATENCIN MDICA SI:   Tiene mareos.  Siente clicos leves, presin en la pelvis o dolor persistente en el abdomen.  Tiene nuseas, vmitos o diarrea persistentes.  Tiene secrecin vaginal con mal olor.  Siente dolor al [employer].  Tiene el rostro, las [place], las piernas o los tobillos ms hinchados. SOLICITE ATENCIN MDICA DE INMEDIATO SI:   Tiene fiebre.  Tiene una prdida de lquido por la vagina.  Tiene sangrado o pequeas prdidas vaginales.  Siente dolor intenso o clicos en el abdomen.  Sube o baja de peso rpidamente.  Vomita sangre de color rojo brillante o material que parezca granos de caf.  Ha estado expuesta a la rubola y no ha sufrido la enfermedad.  Ha estado expuesta a la quinta enfermedad o a la varicela.  Tiene un dolor de cabeza intenso.  Le falta el aire.  Sufre cualquier tipo de traumatismo, por ejemplo, debido a una cada o un accidente automovilstico. Document  Released: 06/22/2005 Document Revised: 01/27/2014 [facility] Patient Information 2015 [place], [place]. This information is not intended to replace advice given to you by your health care provider. Make sure you discuss any questions you have with your health care provider. Return in 1 week for [place]

## 2015-06-16 ENCOUNTER — Telehealth: Payer: Self-pay | Admitting: Adult Health

## 2015-06-16 ENCOUNTER — Ambulatory Visit: Payer: BLUE CROSS/BLUE SHIELD | Admitting: Obstetrics and Gynecology

## 2015-06-16 LAB — BETA HCG QUANT (REF LAB): HCG QUANT: 13284 m[IU]/mL

## 2015-06-16 LAB — PROGESTERONE: PROGESTERONE: 30.1 ng/mL

## 2015-06-16 NOTE — Telephone Encounter (Signed)
Maria [name] aware of labs,they are good keep appt next week for [place]

## 2015-06-23 ENCOUNTER — Ambulatory Visit (INDEPENDENT_AMBULATORY_CARE_PROVIDER_SITE_OTHER): Payer: BLUE CROSS/BLUE SHIELD

## 2015-06-23 DIAGNOSIS — O3680X0 Pregnancy with inconclusive fetal viability, not applicable or unspecified: Secondary | ICD-10-CM | POA: Diagnosis not present

## 2015-06-23 NOTE — Progress Notes (Signed)
[place] 7+1wks IUP w/YS,crl 6.18mm,pos fht 141 bpm,normal ov's bilat

## 2015-06-25 ENCOUNTER — Telehealth: Payer: Self-pay | Admitting: Obstetrics & Gynecology

## 2015-07-01 ENCOUNTER — Encounter: Payer: Self-pay | Admitting: Advanced Practice Midwife

## 2015-07-01 ENCOUNTER — Ambulatory Visit (INDEPENDENT_AMBULATORY_CARE_PROVIDER_SITE_OTHER): Payer: BLUE CROSS/BLUE SHIELD | Admitting: Advanced Practice Midwife

## 2015-07-01 VITALS — BP 120/60 | HR 80 | Wt 113.4 lb

## 2015-07-01 DIAGNOSIS — Z369 Encounter for antenatal screening, unspecified: Secondary | ICD-10-CM

## 2015-07-01 DIAGNOSIS — Z1389 Encounter for screening for other disorder: Secondary | ICD-10-CM

## 2015-07-01 DIAGNOSIS — Z331 Pregnant state, incidental: Secondary | ICD-10-CM

## 2015-07-01 DIAGNOSIS — Z3401 Encounter for supervision of normal first pregnancy, first trimester: Secondary | ICD-10-CM | POA: Diagnosis not present

## 2015-07-01 DIAGNOSIS — Z0283 Encounter for blood-alcohol and blood-drug test: Secondary | ICD-10-CM

## 2015-07-01 LAB — POCT URINALYSIS DIPSTICK
Blood, UA: NEGATIVE
Glucose, UA: NEGATIVE
KETONES UA: NEGATIVE
Leukocytes, UA: NEGATIVE
Nitrite, UA: NEGATIVE
PROTEIN UA: NEGATIVE

## 2015-07-01 NOTE — Patient Instructions (Signed)

## 2015-07-01 NOTE — Progress Notes (Signed)
  Subjective:    Maria [name] is a G1P0 [redacted]w[redacted]d being seen today for her first obstetrical visit.  THis is her first pregnancy. Patient reports some mild cramping/back ache, heartburn after eating spicy food. Ceasar Mons Vitals:   07/01/15 1558  BP: 120/60  Pulse: 80  Weight: 113 lb 6.4 oz (51.438 kg)    HISTORY: OB History  Gravida Para Term Preterm AB SAB TAB Ectopic Multiple Living  1             # Outcome Date GA Lbr Len/2nd Weight Sex Delivery Anes PTL Lv  1 Current              Past Medical History  Diagnosis Date  . Amenorrhea 03/25/2013  . Patient desires pregnancy 04/15/2013  . Pregnant 06/15/2015   History reviewed. No pertinent past surgical history. Family History  Problem Relation Age of Onset  . Cancer Mother 40    breast     Exam                                      System:     Skin: normal coloration and turgor, no rashes    Neurologic: oriented, normal, normal mood   Extremities: normal strength, tone, and muscle mass   HEENT PERRLA   Mouth/Teeth mucous membranes moist, normal dentition   Neck supple and no masses   Cardiovascular: regular rate and rhythm   Respiratory:  appears well, vitals normal, no respiratory distress, acyanotic   Abdomen: soft, non-tender;  FHR: 160 [place]          Assessment:    Pregnancy: G1P0 Patient Active Problem List   Diagnosis Date Noted  . Pregnant 06/15/2015  . 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 Family history of breast cancer in mother 08/20/2014  . Mastalgia in female 08/20/2014  . Patient desires pregnancy 04/15/2013  . Amenorrhea 03/25/2013        Plan:    Used language line Initial labs drawn. Continue prenatal vitamins  Problem list reviewed and updated  Reviewed n/v relief measures and warning s/s to report  Reviewed recommended weight gain based on pre-gravid BMI  Encouraged well-balanced diet Genetic Screening discussed Integrated Screen: declined.  Ultrasound  discussed; fetal survey: requested.  Follow up in 4 weeks for LROBU.  CRESENZO-DISHMAN,Rashaad Hallstrom 07/01/2015

## 2015-07-02 LAB — GC/CHLAMYDIA PROBE AMP
Chlamydia trachomatis, NAA: NEGATIVE
NEISSERIA GONORRHOEAE BY PCR: NEGATIVE

## 2015-07-03 LAB — SICKLE CELL SCREEN: SICKLE CELL SCREEN: NEGATIVE

## 2015-07-03 LAB — URINALYSIS, ROUTINE W REFLEX MICROSCOPIC
BILIRUBIN UA: NEGATIVE
Glucose, UA: NEGATIVE
Ketones, UA: NEGATIVE
Nitrite, UA: NEGATIVE
PH UA: 6.5 (ref 5.0–7.5)
Protein, UA: NEGATIVE
RBC UA: NEGATIVE
Specific Gravity, UA: 1.012 (ref 1.005–1.030)
Urobilinogen, Ur: 1 mg/dL (ref 0.2–1.0)

## 2015-07-03 LAB — CBC
Hematocrit: 40.6 % (ref 34.0–46.6)
Hemoglobin: 13.4 g/dL (ref 11.1–15.9)
MCH: 29.7 pg (ref 26.6–33.0)
MCHC: 33 g/dL (ref 31.5–35.7)
MCV: 90 fL (ref 79–97)
Platelets: 346 10*3/uL (ref 150–379)
RBC: 4.51 x10E6/uL (ref 3.77–5.28)
RDW: 13.4 % (ref 12.3–15.4)
WBC: 11.4 10*3/uL — AB (ref 3.4–10.8)

## 2015-07-03 LAB — PMP SCREEN PROFILE (10S), URINE
Amphetamine Screen, Ur: NEGATIVE ng/mL
Barbiturate Screen, Ur: NEGATIVE ng/mL
Benzodiazepine Screen, Urine: NEGATIVE ng/mL
CANNABINOIDS UR QL SCN: NEGATIVE ng/mL
Cocaine(Metab.)Screen, Urine: NEGATIVE ng/mL
Creatinine(Crt), U: 54.4 mg/dL (ref 20.0–300.0)
Methadone Scn, Ur: NEGATIVE ng/mL
OPIATE SCRN UR: NEGATIVE ng/mL
OXYCODONE+OXYMORPHONE UR QL SCN: NEGATIVE ng/mL
PCP Scrn, Ur: NEGATIVE ng/mL
Ph of Urine: 6.5 (ref 4.5–8.9)
Propoxyphene, Screen: NEGATIVE ng/mL

## 2015-07-03 LAB — HEPATITIS B SURFACE ANTIGEN: HEP B S AG: NEGATIVE

## 2015-07-03 LAB — URINE CULTURE: Organism ID, Bacteria: NO GROWTH

## 2015-07-03 LAB — MICROSCOPIC EXAMINATION
Casts: NONE SEEN /lpf
Epithelial Cells (non renal): NONE SEEN /hpf (ref 0–10)

## 2015-07-03 LAB — ANTIBODY SCREEN: Antibody Screen: NEGATIVE

## 2015-07-03 LAB — ABO/RH: RH TYPE: POSITIVE

## 2015-07-03 LAB — VARICELLA ZOSTER ANTIBODY, IGG: Varicella zoster IgG: >4000 index (ref >165)

## 2015-07-03 LAB — HIV ANTIBODY (ROUTINE TESTING W REFLEX): HIV Screen 4th Generation wRfx: NONREACTIVE

## 2015-07-03 LAB — RPR: RPR Ser Ql: NONREACTIVE

## 2015-07-03 LAB — RUBELLA SCREEN: Rubella Antibodies, IGG: 10.6 index (ref >0.99)

## 2015-07-14 ENCOUNTER — Telehealth: Payer: Self-pay | Admitting: Advanced Practice Midwife

## 2015-07-14 NOTE — Telephone Encounter (Signed)
Pt informed all the labs from 07/01/2015 WNL per Cathie BeamsFran Cresenzo-Dishmon, CNM.

## 2015-07-15 ENCOUNTER — Ambulatory Visit (INDEPENDENT_AMBULATORY_CARE_PROVIDER_SITE_OTHER): Payer: BLUE CROSS/BLUE SHIELD | Admitting: Advanced Practice Midwife

## 2015-07-15 VITALS — BP 134/56 | HR 110 | Wt 112.0 lb

## 2015-07-15 DIAGNOSIS — Z1389 Encounter for screening for other disorder: Secondary | ICD-10-CM | POA: Diagnosis not present

## 2015-07-15 DIAGNOSIS — J069 Acute upper respiratory infection, unspecified: Secondary | ICD-10-CM

## 2015-07-15 DIAGNOSIS — Z331 Pregnant state, incidental: Secondary | ICD-10-CM | POA: Diagnosis not present

## 2015-07-15 LAB — POCT URINALYSIS DIPSTICK
Blood, UA: NEGATIVE
Glucose, UA: NEGATIVE
Ketones, UA: NEGATIVE
Leukocytes, UA: NEGATIVE
Nitrite, UA: NEGATIVE
Protein, UA: NEGATIVE

## 2015-07-15 NOTE — Patient Instructions (Signed)
Saline nose spray Robitussin(guiafienisin) Gargle with salt water

## 2015-07-15 NOTE — Progress Notes (Signed)
G1P0 6075w2d Estimated Date of Delivery: 02/08/16  Blood pressure 134/56, pulse 110, weight 112 lb (50.803 kg), last menstrual period 05/04/2015.    WORK IN FOR SORE THROAT, CONGESTION FOR 4 DAYS; Also c/o lower abdominal pain when she cought BP weight and urine results all reviewed and noted  Patient denies any bleeding and no rupture of membranes symptoms or regular contractions.  Dry cough (sometimes productive per pt) Throat sl red, no exudate.  Ears clear.  LCTAB  All questions were answered.  Orders Placed This Encounter  Procedures  . POCT urinalysis dipstick   Probably viral URI Plan:  Salt water gargle, robitussin PRN, saline spray.  If sx not much better by Monday, CALL FOR ANTIBIOTIC prescription (wont need to come in)    Return for As scheduled.

## 2015-07-20 ENCOUNTER — Telehealth: Payer: Self-pay | Admitting: Advanced Practice Midwife

## 2015-07-20 NOTE — Telephone Encounter (Signed)
Pt states saw Cathie BeamsFran Cresenzo-Dishmon, CNM on 07/15/2015 for cough, sore throat. Cough not any better. Pt informed Drenda FreezeFran will be back in the office on tomorrow will route message. Per Drenda FreezeFran note from 07/15/2015 office visit "will give Rx for antibiotic if symptoms persist, pt will not need an appt."

## 2015-07-23 MED ORDER — AZITHROMYCIN 250 MG PO TABS: 250.0000 mg | ORAL_TABLET | Freq: Every day | ORAL | Status: DC

## 2015-07-23 NOTE — Telephone Encounter (Signed)
z pack for URI sx >10 days.

## 2015-07-30 ENCOUNTER — Ambulatory Visit (INDEPENDENT_AMBULATORY_CARE_PROVIDER_SITE_OTHER): Payer: BLUE CROSS/BLUE SHIELD | Admitting: Advanced Practice Midwife

## 2015-07-30 ENCOUNTER — Encounter: Payer: Self-pay | Admitting: Advanced Practice Midwife

## 2015-07-30 VITALS — BP 130/72 | HR 100 | Wt 111.5 lb

## 2015-07-30 DIAGNOSIS — Z1389 Encounter for screening for other disorder: Secondary | ICD-10-CM

## 2015-07-30 DIAGNOSIS — Z3402 Encounter for supervision of normal first pregnancy, second trimester: Secondary | ICD-10-CM

## 2015-07-30 DIAGNOSIS — Z331 Pregnant state, incidental: Secondary | ICD-10-CM

## 2015-07-30 LAB — POCT URINALYSIS DIPSTICK
Blood, UA: NEGATIVE
Glucose, UA: NEGATIVE
Ketones, UA: NEGATIVE
Leukocytes, UA: NEGATIVE
Nitrite, UA: NEGATIVE
Protein, UA: NEGATIVE

## 2015-07-30 MED ORDER — ONDANSETRON HCL 4 MG PO TABS: 4.0000 mg | ORAL_TABLET | Freq: Four times a day (QID) | ORAL | Status: DC | PRN

## 2015-07-30 NOTE — Progress Notes (Signed)
Pt states that she has nausea a lot.

## 2015-07-30 NOTE — Progress Notes (Signed)
G1P0 4217w3d Estimated Date of Delivery: 02/08/16  Blood pressure 130/72, pulse 100, weight 111 lb 8 oz (50.576 kg), last menstrual period 05/04/2015.   BP weight and urine results all reviewed and noted.  Please refer to the obstetrical flow sheet for the fundal height and fetal heart rate documentation:  Patient reports good fetal movement, denies any bleeding and no rupture of membranes symptoms or regular contractions. Patient is without complaints. Still coughing , but URI better.  All questions were answered.  Orders Placed This Encounter  Procedures  . POCT urinalysis dipstick    Plan:  Continued routine obstetrical care,   Return in about 4 weeks (around 08/27/2015) for LROB.

## 2015-08-11 ENCOUNTER — Ambulatory Visit: Payer: BLUE CROSS/BLUE SHIELD | Admitting: Obstetrics and Gynecology

## 2015-08-27 ENCOUNTER — Encounter: Payer: Self-pay | Admitting: Obstetrics and Gynecology

## 2015-08-27 ENCOUNTER — Ambulatory Visit (INDEPENDENT_AMBULATORY_CARE_PROVIDER_SITE_OTHER): Payer: BLUE CROSS/BLUE SHIELD | Admitting: Obstetrics and Gynecology

## 2015-08-27 VITALS — BP 110/70 | HR 88 | Wt 113.5 lb

## 2015-08-27 DIAGNOSIS — Z331 Pregnant state, incidental: Secondary | ICD-10-CM

## 2015-08-27 DIAGNOSIS — Z3A16 16 weeks gestation of pregnancy: Secondary | ICD-10-CM

## 2015-08-27 DIAGNOSIS — Z3482 Encounter for supervision of other normal pregnancy, second trimester: Secondary | ICD-10-CM

## 2015-08-27 DIAGNOSIS — Z1389 Encounter for screening for other disorder: Secondary | ICD-10-CM

## 2015-08-27 LAB — POCT URINALYSIS DIPSTICK
Blood, UA: NEGATIVE
Glucose, UA: NEGATIVE
Ketones, UA: NEGATIVE
Leukocytes, UA: NEGATIVE
NITRITE UA: NEGATIVE
Protein, UA: NEGATIVE

## 2015-08-27 NOTE — Progress Notes (Signed)
Patient ID: Maria [name], female   DOB: 03/21/1991, 24 y.o.   MRN: [id number]  G1P0 582w3d Estimated Date of Delivery: 02/08/16  Blood pressure 110/70, pulse 88, weight 113 lb 8 oz (51.483 kg), last menstrual period 05/04/2015.   refer to the ob flow sheet for FH and FHR, also BP, Wt, Urine results: negative  Patient reports too early for fetal movement, denies any bleeding and no rupture of membranes symptoms or regular contractions. Patient complaints: She has not complaints at this time. Pt is unsure if she will breastfeed. She is taking prenatal vitamins  FHR: 145 Edema: none  Questions were answered. Assessment: LROB G1P0 @ 372w3d                          Plan:  Continued routine obstetrical care,  F/u in 4 weeks for LROB appt and US   By signing my name below, I, Jarvis Morganaylor Eleyna Brugh, attest that this documentation has been prepared under the direction and in the presence of Tilda BurrowJohn Aelyn Stanaland V, MD. Electronically Signed: Jarvis Morganaylor Ajamu Maxon, ED Scribe. 08/27/2015. 4:11 PM.  I personally performed the services described in this documentation, which was SCRIBED in my presence. The recorded information has been reviewed and considered accurate. It has been edited as necessary during review. Tilda BurrowFERGUSON,Airam Heidecker V, MD

## 2015-08-27 NOTE — Progress Notes (Signed)
Pt denies any problems or concerns at this time.  

## 2015-09-24 ENCOUNTER — Other Ambulatory Visit: Payer: Self-pay | Admitting: Obstetrics and Gynecology

## 2015-09-24 DIAGNOSIS — Z364 Encounter for antenatal screening for fetal growth retardation: Secondary | ICD-10-CM

## 2015-09-25 ENCOUNTER — Encounter: Payer: Self-pay | Admitting: Obstetrics and Gynecology

## 2015-09-25 ENCOUNTER — Ambulatory Visit (INDEPENDENT_AMBULATORY_CARE_PROVIDER_SITE_OTHER): Payer: BLUE CROSS/BLUE SHIELD

## 2015-09-25 ENCOUNTER — Ambulatory Visit (INDEPENDENT_AMBULATORY_CARE_PROVIDER_SITE_OTHER): Payer: BLUE CROSS/BLUE SHIELD | Admitting: Obstetrics and Gynecology

## 2015-09-25 VITALS — BP 120/60 | HR 98 | Wt 117.6 lb

## 2015-09-25 DIAGNOSIS — Z3A20 20 weeks gestation of pregnancy: Secondary | ICD-10-CM

## 2015-09-25 DIAGNOSIS — O321XX1 Maternal care for breech presentation, fetus 1: Secondary | ICD-10-CM

## 2015-09-25 DIAGNOSIS — Z364 Encounter for antenatal screening for fetal growth retardation: Secondary | ICD-10-CM

## 2015-09-25 DIAGNOSIS — Z331 Pregnant state, incidental: Secondary | ICD-10-CM

## 2015-09-25 DIAGNOSIS — Z36 Encounter for antenatal screening of mother: Secondary | ICD-10-CM

## 2015-09-25 DIAGNOSIS — Z3402 Encounter for supervision of normal first pregnancy, second trimester: Secondary | ICD-10-CM

## 2015-09-25 DIAGNOSIS — Z1389 Encounter for screening for other disorder: Secondary | ICD-10-CM

## 2015-09-25 LAB — POCT URINALYSIS DIPSTICK
Blood, UA: NEGATIVE
GLUCOSE UA: NEGATIVE
KETONES UA: NEGATIVE
Nitrite, UA: NEGATIVE
PROTEIN UA: NEGATIVE

## 2015-09-25 NOTE — Progress Notes (Signed)
US 20+4wks,measurements c/w dates,normal ov's bilat,cx 3.3 cm,post pl gr 0,breech,fhr 147 bpm,efw 327 g,anatomy complete no obvious abn seen

## 2015-09-25 NOTE — Progress Notes (Signed)
Patient ID: Virgina EvenerDulce Yaun, female   DOB: 03/21/1991, 24 y.o.   MRN: [id number] G1P0 561w4d Estimated Date of Delivery: 02/08/16  Blood pressure 120/60, pulse 98, weight 117 lb 9.6 oz (53.343 kg), last menstrual period 05/04/2015.   refer to the ob flow sheet for FH and FHR, also BP, Wt, Urine results:notable for small amount of leukocytes   Patient reports  + good fetal movement, denies any bleeding and no rupture of membranes symptoms or regular contractions. Patient complaints: none.  FHR- 147 bpm   Questions were answered. Assessment: LROB G1P0 @ 551w4d  Fetal US today normal  [facility] provide [facility]   Plan:  Continued routine obstetrical care  F/u in 4 weeks for routine prenatal care    By signing my name below, I, Doreatha MartinEva Mathews, attest that this documentation has been prepared under the direction and in the presence of Tilda BurrowJohn Divina Neale V, MD. Electronically Signed: Doreatha MartinEva Mathews, ED Scribe. 09/25/2015. 9:25 AM.  I personally performed the services described in this documentation, which was SCRIBED in my presence. The recorded information has been reviewed and considered accurate. It has been edited as necessary during review. Tilda BurrowFERGUSON,Kylina Vultaggio V, MD

## 2015-09-25 NOTE — Patient Instructions (Signed)
(  [phone] is the phone number for Pregnancy Classes at [facility].   

## 2015-09-27 NOTE — L&D Delivery Note (Signed)
Delivery Note Pt presented in active labor. Labor progressed spontaneously. At 6:06 PM a viable female was delivered via Vaginal, Spontaneous Delivery (Presentation: ; Occiput Anterior).  APGAR: 8, 9; weight pending.   Placenta status: Intact, Spontaneous.  Cord: 3 vessels with the following complications: None.  Cord pH: not obtained  Anesthesia: Local  Episiotomy: None Lacerations:  1st degree perineal laceration requiring repair Suture Repair: 3.0 vicryl Est. Blood Loss (mL): 642mL  Mom to postpartum.  Baby to Couplet care / Skin to Skin.  [name] 01/26/2016, 6:35 PM   OB fellow attestation: Patient is a G1P1001 at 4910w1d who was admitted in active labor. Uncomplicated prenatal course.   I was gloved and present for delivery in its entirety.  Second stage of labor progressed.   Complications: none  Lacerations: 1st degree, repeaired  EBL: 642 mL  Federico FlakeKimberly Niles Ramiya Delahunty, MD 7:39 PM

## 2015-10-13 ENCOUNTER — Other Ambulatory Visit: Payer: Self-pay | Admitting: *Deleted

## 2015-10-13 MED ORDER — CITRANATAL DHA 27-1 & 250 MG PO MISC: 1.0000 | Freq: Every day | ORAL | Status: DC

## 2015-10-22 ENCOUNTER — Ambulatory Visit (INDEPENDENT_AMBULATORY_CARE_PROVIDER_SITE_OTHER): Payer: BLUE CROSS/BLUE SHIELD | Admitting: Obstetrics and Gynecology

## 2015-10-22 ENCOUNTER — Encounter: Payer: Self-pay | Admitting: Obstetrics and Gynecology

## 2015-10-22 VITALS — BP 112/60 | HR 87 | Wt 121.0 lb

## 2015-10-22 DIAGNOSIS — Z331 Pregnant state, incidental: Secondary | ICD-10-CM

## 2015-10-22 DIAGNOSIS — Z3401 Encounter for supervision of normal first pregnancy, first trimester: Secondary | ICD-10-CM

## 2015-10-22 DIAGNOSIS — Z1389 Encounter for screening for other disorder: Secondary | ICD-10-CM

## 2015-10-22 LAB — POCT URINALYSIS DIPSTICK
Blood, UA: NEGATIVE
Glucose, UA: NEGATIVE
Ketones, UA: NEGATIVE
LEUKOCYTES UA: NEGATIVE
NITRITE UA: NEGATIVE
PROTEIN UA: NEGATIVE

## 2015-10-22 NOTE — Progress Notes (Signed)
Patient ID: Maria [name], female   DOB: 11-21-1990, 25 y.o.   MRN: [id number]  G1P0 [redacted]w[redacted]d Estimated Date of Delivery: 02/08/16  Blood pressure 112/60, pulse 87, weight 121 lb (54.885 kg), last menstrual period 05/04/2015.   refer to the ob flow sheet for FH and FHR, also BP, Wt, Urine results:notable for negative  Patient reports  + good fetal movement, denies any bleeding and no rupture of membranes symptoms or regular contractions. Patient complaints: none. Pt states she is interested in childbirth classes.   FHR- 146 bpm  FH- 24 cm   Questions were answered. Assessment: LROB G1P0 @ [redacted]w[redacted]d  Will provided information for childbirth classes   [phone] is the phone number for Pregnancy Classes at [facility].    Please check out [email or web address]   for more information on childbirth classes    Plan:  Continued routine obstetrical care  F/u in 4 weeks for routine prenatal care     By signing my name below, I, Doreatha Martin, attest that this documentation has been prepared under the direction and in the presence of Tilda Burrow, MD. Electronically Signed: Doreatha Martin, ED Scribe. 10/22/2015. 4:35 PM.  I personally performed the services described in this documentation, which was SCRIBED in my presence. The recorded information has been reviewed and considered accurate. It has been edited as necessary during review. Tilda Burrow, MD

## 2015-10-22 NOTE — Progress Notes (Signed)
Pt has noticed some lower back pain.

## 2015-11-17 ENCOUNTER — Telehealth: Payer: Self-pay | Admitting: *Deleted

## 2015-11-17 NOTE — Telephone Encounter (Signed)
Insurance does not cover PNV at all, and Pt bought OTC PNV's per Pharmacy.

## 2015-11-19 ENCOUNTER — Encounter: Payer: Self-pay | Admitting: Obstetrics and Gynecology

## 2015-11-19 ENCOUNTER — Ambulatory Visit (INDEPENDENT_AMBULATORY_CARE_PROVIDER_SITE_OTHER): Payer: BLUE CROSS/BLUE SHIELD | Admitting: Obstetrics and Gynecology

## 2015-11-19 VITALS — BP 98/60 | HR 84 | Wt 124.5 lb

## 2015-11-19 DIAGNOSIS — D72829 Elevated white blood cell count, unspecified: Secondary | ICD-10-CM | POA: Diagnosis not present

## 2015-11-19 DIAGNOSIS — Z3401 Encounter for supervision of normal first pregnancy, first trimester: Secondary | ICD-10-CM

## 2015-11-19 DIAGNOSIS — Z331 Pregnant state, incidental: Secondary | ICD-10-CM | POA: Diagnosis not present

## 2015-11-19 DIAGNOSIS — R319 Hematuria, unspecified: Secondary | ICD-10-CM | POA: Diagnosis not present

## 2015-11-19 DIAGNOSIS — Z1389 Encounter for screening for other disorder: Secondary | ICD-10-CM

## 2015-11-19 DIAGNOSIS — Z3A29 29 weeks gestation of pregnancy: Secondary | ICD-10-CM

## 2015-11-19 LAB — POCT URINALYSIS DIPSTICK
Glucose, UA: NEGATIVE
Ketones, UA: NEGATIVE
NITRITE UA: NEGATIVE
Protein, UA: NEGATIVE

## 2015-11-19 LAB — POCT HEMOGLOBIN: HEMOGLOBIN: 11.7 g/dL — AB (ref 12.2–16.2)

## 2015-11-19 MED ORDER — SULFAMETHOXAZOLE-TRIMETHOPRIM 400-80 MG PO TABS: 1.0000 | ORAL_TABLET | Freq: Two times a day (BID) | ORAL | Status: DC

## 2015-11-19 NOTE — Progress Notes (Signed)
Pt states that she has been having some back pain.

## 2015-11-19 NOTE — Progress Notes (Signed)
Patient ID: Maria [name], female   DOB: 12/25/1990, 25 y.o.   MRN: [id number]  G1P0 [redacted]w[redacted]d Estimated Date of Delivery: 02/08/16  Blood pressure 98/60, pulse 84, weight 124 lb 8 oz (56.473 kg), last menstrual period 05/04/2015.   refer to the ob flow sheet for FH and FHR, also BP, Wt, Urine results:notable for +2 leukocytes, trace blood  Patient reports  + good fetal movement, denies any bleeding and no rupture of membranes symptoms or regular contractions. Patient complaints:none.  FHR- 152 bpm  FH- 28 cm   Questions were answered. Assessment: LROB G1P0 @ [redacted]w[redacted]d   Plan:   Continued routine obstetrical care,  Empiric tx to r/o UTI with Macrodantin CBG in office  Order GTT and Hgb    F/u in 2 weeks for routine prenatal care    By signing my name below, I, Doreatha Martin, attest that this documentation has been prepared under the direction and in the presence of Tilda Burrow, MD. Electronically Signed: Doreatha Martin, ED Scribe. 11/19/2015. 4:02 PM.  I personally performed the services described in this documentation, which was SCRIBED in my presence. The recorded information has been reviewed and considered accurate. It has been edited as necessary during review. Tilda Burrow, MD

## 2015-11-23 ENCOUNTER — Other Ambulatory Visit: Payer: BLUE CROSS/BLUE SHIELD

## 2015-11-23 ENCOUNTER — Other Ambulatory Visit: Payer: Self-pay | Admitting: *Deleted

## 2015-11-23 DIAGNOSIS — Z131 Encounter for screening for diabetes mellitus: Secondary | ICD-10-CM

## 2015-11-23 DIAGNOSIS — Z369 Encounter for antenatal screening, unspecified: Secondary | ICD-10-CM

## 2015-11-24 LAB — GLUCOSE TOLERANCE, 2 HOURS W/ 1HR
GLUCOSE, FASTING: 77 mg/dL (ref 65–91)
Glucose, 1 hour: 75 mg/dL (ref 65–179)
Glucose, 2 hour: 77 mg/dL (ref 65–152)

## 2015-12-15 ENCOUNTER — Encounter: Payer: Self-pay | Admitting: Obstetrics and Gynecology

## 2015-12-15 ENCOUNTER — Ambulatory Visit (INDEPENDENT_AMBULATORY_CARE_PROVIDER_SITE_OTHER): Payer: BLUE CROSS/BLUE SHIELD | Admitting: Obstetrics and Gynecology

## 2015-12-15 VITALS — BP 110/60 | HR 95 | Wt 125.5 lb

## 2015-12-15 DIAGNOSIS — Z1389 Encounter for screening for other disorder: Secondary | ICD-10-CM

## 2015-12-15 DIAGNOSIS — Z331 Pregnant state, incidental: Secondary | ICD-10-CM

## 2015-12-15 DIAGNOSIS — Z3401 Encounter for supervision of normal first pregnancy, first trimester: Secondary | ICD-10-CM

## 2015-12-15 LAB — POCT URINALYSIS DIPSTICK
Blood, UA: NEGATIVE
GLUCOSE UA: NEGATIVE
KETONES UA: NEGATIVE
LEUKOCYTES UA: NEGATIVE
Nitrite, UA: NEGATIVE
PROTEIN UA: NEGATIVE

## 2015-12-15 NOTE — Progress Notes (Signed)
Pt denies any problems or concerns at this time.  

## 2015-12-15 NOTE — Progress Notes (Signed)
G1P0 6515w1d Estimated Date of Delivery: 02/08/16  LROB Blood pressure 110/60, pulse 95, weight 125 lb 8 oz (56.926 kg), last menstrual period 05/04/2015.   refer to the ob flow sheet for FH and FHR, also BP, Wt, Urine results:notable for neg protein  Patient reports   good fetal movement, denies any bleeding and no rupture of membranes symptoms or regular contractions. Patient complaints:none. GTT normal Questions were answered. Assessment: LROB G1P0 @ 5815w1d   Plan:  Continued routine obstetrical care,   F/u in 4 weeks for lrob

## 2016-01-12 ENCOUNTER — Encounter: Payer: Self-pay | Admitting: Obstetrics and Gynecology

## 2016-01-12 ENCOUNTER — Ambulatory Visit (INDEPENDENT_AMBULATORY_CARE_PROVIDER_SITE_OTHER): Payer: BLUE CROSS/BLUE SHIELD | Admitting: Obstetrics and Gynecology

## 2016-01-12 VITALS — BP 120/70 | HR 100 | Wt 134.0 lb

## 2016-01-12 DIAGNOSIS — N912 Amenorrhea, unspecified: Secondary | ICD-10-CM

## 2016-01-12 DIAGNOSIS — Z1389 Encounter for screening for other disorder: Secondary | ICD-10-CM

## 2016-01-12 DIAGNOSIS — Z331 Pregnant state, incidental: Secondary | ICD-10-CM

## 2016-01-12 DIAGNOSIS — Z3493 Encounter for supervision of normal pregnancy, unspecified, third trimester: Secondary | ICD-10-CM

## 2016-01-12 LAB — POCT URINALYSIS DIPSTICK
Glucose, UA: NEGATIVE
Ketones, UA: NEGATIVE
Leukocytes, UA: NEGATIVE
NITRITE UA: NEGATIVE
Protein, UA: NEGATIVE
RBC UA: NEGATIVE

## 2016-01-12 NOTE — Progress Notes (Signed)
G1P0 2426w1d Estimated Date of Delivery: 02/08/16 LROB Blood pressure 120/70, pulse 100, weight 134 lb (60.782 kg), last menstrual period 05/04/2015.   refer to the ob flow sheet for FH and FHR, also BP, Wt, Urine results:notable for good FM.  Patient reports   good fetal movement, denies any bleeding and no rupture of membranes symptoms or regular contractions. Patient complaints:none. . Pt asks about braxton hicks vs labor. Examples of contractions discussed. Questions were answered. Assessment: LROB G1P0 @ 4626w1d normal care to date  Plan:  Continued routine obstetrical care, begin weekly visits  F/u in 1 weeks for GBS. GC/ Chl.

## 2016-01-12 NOTE — Progress Notes (Signed)
Pt denies any problems or concerns at this time.  

## 2016-01-19 ENCOUNTER — Ambulatory Visit (INDEPENDENT_AMBULATORY_CARE_PROVIDER_SITE_OTHER): Payer: BLUE CROSS/BLUE SHIELD | Admitting: Obstetrics & Gynecology

## 2016-01-19 ENCOUNTER — Encounter: Payer: Self-pay | Admitting: Obstetrics & Gynecology

## 2016-01-19 VITALS — BP 120/80 | HR 80 | Wt 135.0 lb

## 2016-01-19 DIAGNOSIS — Z3493 Encounter for supervision of normal pregnancy, unspecified, third trimester: Secondary | ICD-10-CM

## 2016-01-19 DIAGNOSIS — Z118 Encounter for screening for other infectious and parasitic diseases: Secondary | ICD-10-CM

## 2016-01-19 DIAGNOSIS — Z1389 Encounter for screening for other disorder: Secondary | ICD-10-CM

## 2016-01-19 DIAGNOSIS — Z1159 Encounter for screening for other viral diseases: Secondary | ICD-10-CM

## 2016-01-19 DIAGNOSIS — Z3A38 38 weeks gestation of pregnancy: Secondary | ICD-10-CM

## 2016-01-19 DIAGNOSIS — Z3403 Encounter for supervision of normal first pregnancy, third trimester: Secondary | ICD-10-CM

## 2016-01-19 DIAGNOSIS — Z331 Pregnant state, incidental: Secondary | ICD-10-CM

## 2016-01-19 DIAGNOSIS — Z3685 Encounter for antenatal screening for Streptococcus B: Secondary | ICD-10-CM

## 2016-01-19 LAB — POCT URINALYSIS DIPSTICK
Blood, UA: NEGATIVE
Glucose, UA: NEGATIVE
KETONES UA: NEGATIVE
Leukocytes, UA: NEGATIVE
Nitrite, UA: NEGATIVE
PROTEIN UA: NEGATIVE

## 2016-01-19 NOTE — Progress Notes (Signed)
G1P0 2545w1d Estimated Date of Delivery: 02/08/16  Blood pressure 120/80, pulse 80, weight 135 lb (61.236 kg), last menstrual period 05/04/2015.   BP weight and urine results all reviewed and noted.  Please refer to the obstetrical flow sheet for the fundal height and fetal heart rate documentation:  Patient reports good fetal movement, denies any bleeding and no rupture of membranes symptoms or regular contractions. Patient is without complaints. All questions were answered.  Orders Placed This Encounter  Procedures  . GC/Chlamydia Probe Amp  . Strep Gp B NAA  . POCT urinalysis dipstick    Plan:  Continued routine obstetrical care,   GBS and cultures done  No Follow-up on file.

## 2016-01-21 LAB — GC/CHLAMYDIA PROBE AMP
Chlamydia trachomatis, NAA: NEGATIVE
NEISSERIA GONORRHOEAE BY PCR: NEGATIVE

## 2016-01-21 LAB — STREP GP B NAA: STREP GROUP B AG: NEGATIVE

## 2016-01-26 ENCOUNTER — Telehealth: Payer: Self-pay | Admitting: *Deleted

## 2016-01-26 ENCOUNTER — Encounter (HOSPITAL_COMMUNITY): Payer: Self-pay | Admitting: *Deleted

## 2016-01-26 ENCOUNTER — Encounter: Payer: BLUE CROSS/BLUE SHIELD | Admitting: Obstetrics & Gynecology

## 2016-01-26 ENCOUNTER — Inpatient Hospital Stay (HOSPITAL_COMMUNITY)
Admission: AD | Admit: 2016-01-26 | Discharge: 2016-01-28 | DRG: 775 | Disposition: A | Discharge status: Home / Self Care | Payer: BLUE CROSS/BLUE SHIELD | Source: Ambulatory Visit | Attending: Family Medicine | Admitting: Family Medicine

## 2016-01-26 DIAGNOSIS — Z23 Encounter for immunization: Secondary | ICD-10-CM

## 2016-01-26 DIAGNOSIS — IMO0001 Reserved for inherently not codable concepts without codable children: Secondary | ICD-10-CM

## 2016-01-26 DIAGNOSIS — Z3A38 38 weeks gestation of pregnancy: Secondary | ICD-10-CM

## 2016-01-26 HISTORY — DX: Other specified health status: Z78.9

## 2016-01-26 LAB — CBC
HCT: 42.3 % (ref 36.0–46.0)
Hemoglobin: 15.1 g/dL — ABNORMAL HIGH (ref 12.0–15.0)
MCH: 31.5 pg (ref 26.0–34.0)
MCHC: 35.7 g/dL (ref 30.0–36.0)
MCV: 88.3 fL (ref 78.0–100.0)
PLATELETS: 276 10*3/uL (ref 150–400)
RBC: 4.79 MIL/uL (ref 3.87–5.11)
RDW: 13.4 % (ref 11.5–15.5)
WBC: 15.7 10*3/uL — AB (ref 4.0–10.5)

## 2016-01-26 LAB — TYPE AND SCREEN
ABO/RH(D): O POS
ANTIBODY SCREEN: NEGATIVE

## 2016-01-26 LAB — ABO/RH: ABO/RH(D): O POS

## 2016-01-26 MED ORDER — OXYTOCIN BOLUS FROM INFUSION: 500.0000 mL | INTRAVENOUS | Status: DC

## 2016-01-26 MED ORDER — FENTANYL CITRATE (PF) 100 MCG/2ML IJ SOLN
100.0000 ug | INTRAMUSCULAR | Status: DC | PRN
  Administered 2016-01-26 (×2): 100 ug via INTRAVENOUS
  Filled 2016-01-26 (×2): qty 2

## 2016-01-26 MED ORDER — DIBUCAINE 1 % RE OINT
1.0000 | TOPICAL_OINTMENT | RECTAL | Status: DC | PRN
Start: 2016-01-26 — End: 2016-01-28

## 2016-01-26 MED ORDER — DIPHENHYDRAMINE HCL 50 MG/ML IJ SOLN: 12.5000 mg | INTRAMUSCULAR | Status: DC | PRN

## 2016-01-26 MED ORDER — SIMETHICONE 80 MG PO CHEW: 80.0000 mg | CHEWABLE_TABLET | ORAL | Status: DC | PRN

## 2016-01-26 MED ORDER — LACTATED RINGERS IV SOLN: 500.0000 mL | INTRAVENOUS | Status: DC | PRN

## 2016-01-26 MED ORDER — LACTATED RINGERS IV SOLN
INTRAVENOUS | Status: DC
  Administered 2016-01-26: 16:00:00 via INTRAVENOUS

## 2016-01-26 MED ORDER — FENTANYL 2.5 MCG/ML BUPIVACAINE 1/10 % EPIDURAL INFUSION (WH - ANES): 14.0000 mL/h | INTRAMUSCULAR | Status: DC | PRN

## 2016-01-26 MED ORDER — TETANUS-DIPHTH-ACELL PERTUSSIS 5-2.5-18.5 LF-MCG/0.5 IM SUSP
0.5000 mL | Freq: Once | INTRAMUSCULAR | Status: AC
  Administered 2016-01-28: 0.5 mL via INTRAMUSCULAR

## 2016-01-26 MED ORDER — CITRIC ACID-SODIUM CITRATE 334-500 MG/5ML PO SOLN: 30.0000 mL | ORAL | Status: DC | PRN

## 2016-01-26 MED ORDER — LACTATED RINGERS IV SOLN: 500.0000 mL | Freq: Once | INTRAVENOUS | Status: DC

## 2016-01-26 MED ORDER — ACETAMINOPHEN 325 MG PO TABS: 650.0000 mg | ORAL_TABLET | ORAL | Status: DC | PRN

## 2016-01-26 MED ORDER — ONDANSETRON HCL 4 MG/2ML IJ SOLN: 4.0000 mg | INTRAMUSCULAR | Status: DC | PRN

## 2016-01-26 MED ORDER — WITCH HAZEL-GLYCERIN EX PADS: 1.0000 "application " | MEDICATED_PAD | CUTANEOUS | Status: DC | PRN

## 2016-01-26 MED ORDER — EPHEDRINE 5 MG/ML INJ
10.0000 mg | INTRAVENOUS | Status: DC | PRN
  Filled 2016-01-26: qty 2

## 2016-01-26 MED ORDER — PRENATAL MULTIVITAMIN CH
1.0000 | ORAL_TABLET | Freq: Every day | ORAL | Status: DC
  Administered 2016-01-27: 1 via ORAL
  Filled 2016-01-26: qty 1

## 2016-01-26 MED ORDER — OXYCODONE-ACETAMINOPHEN 5-325 MG PO TABS: 2.0000 | ORAL_TABLET | ORAL | Status: DC | PRN

## 2016-01-26 MED ORDER — ONDANSETRON HCL 4 MG/2ML IJ SOLN: 4.0000 mg | Freq: Four times a day (QID) | INTRAMUSCULAR | Status: DC | PRN

## 2016-01-26 MED ORDER — LACTATED RINGERS IV SOLN
2.5000 [IU]/h | INTRAVENOUS | Status: DC
  Administered 2016-01-26: 2.5 [IU]/h via INTRAVENOUS
  Filled 2016-01-26: qty 4

## 2016-01-26 MED ORDER — COCONUT OIL OIL: 1.0000 "application " | TOPICAL_OIL | Status: DC | PRN

## 2016-01-26 MED ORDER — ZOLPIDEM TARTRATE 5 MG PO TABS: 5.0000 mg | ORAL_TABLET | Freq: Every evening | ORAL | Status: DC | PRN

## 2016-01-26 MED ORDER — ONDANSETRON HCL 4 MG PO TABS: 4.0000 mg | ORAL_TABLET | ORAL | Status: DC | PRN

## 2016-01-26 MED ORDER — OXYCODONE-ACETAMINOPHEN 5-325 MG PO TABS
1.0000 | ORAL_TABLET | ORAL | Status: DC | PRN
Start: 2016-01-26 — End: 2016-01-26

## 2016-01-26 MED ORDER — ACETAMINOPHEN 325 MG PO TABS
650.0000 mg | ORAL_TABLET | ORAL | Status: DC | PRN
Start: 2016-01-26 — End: 2016-01-26

## 2016-01-26 MED ORDER — IBUPROFEN 600 MG PO TABS
600.0000 mg | ORAL_TABLET | Freq: Four times a day (QID) | ORAL | Status: DC
  Administered 2016-01-26 – 2016-01-28 (×7): 600 mg via ORAL
  Filled 2016-01-26 (×7): qty 1

## 2016-01-26 MED ORDER — PHENYLEPHRINE 40 MCG/ML (10ML) SYRINGE FOR IV PUSH (FOR BLOOD PRESSURE SUPPORT)
80.0000 ug | PREFILLED_SYRINGE | INTRAVENOUS | Status: DC | PRN
  Filled 2016-01-26: qty 5

## 2016-01-26 MED ORDER — LIDOCAINE HCL (PF) 1 % IJ SOLN
30.0000 mL | INTRAMUSCULAR | Status: DC | PRN
  Administered 2016-01-26: 30 mL via SUBCUTANEOUS
  Filled 2016-01-26: qty 30

## 2016-01-26 MED ORDER — DIPHENHYDRAMINE HCL 25 MG PO CAPS: 25.0000 mg | ORAL_CAPSULE | Freq: Four times a day (QID) | ORAL | Status: DC | PRN

## 2016-01-26 MED ORDER — SENNOSIDES-DOCUSATE SODIUM 8.6-50 MG PO TABS
2.0000 | ORAL_TABLET | ORAL | Status: DC
  Administered 2016-01-27 (×2): 2 via ORAL
  Filled 2016-01-26 (×2): qty 2

## 2016-01-26 MED ORDER — BENZOCAINE-MENTHOL 20-0.5 % EX AERO
1.0000 "application " | INHALATION_SPRAY | CUTANEOUS | Status: DC | PRN
  Administered 2016-01-27: 1 via TOPICAL
  Filled 2016-01-26: qty 56

## 2016-01-26 NOTE — Anesthesia Pain Management Evaluation Note (Signed)
  CRNA Pain Management Visit Note  Patient: Maria [name], 25 y.o., female  "Hello I am a member of the anesthesia team at [facility]. We have an anesthesia team available at all times to provide care throughout the hospital, including epidural management and anesthesia for C-section. I don't know your plan for the delivery whether it a natural birth, water birth, IV sedation, nitrous supplementation, doula or epidural, but we want to meet your pain goals."   1.Was your pain managed to your expectations on prior hospitalizations?     2.What is your expectation for pain management during this hospitalization?      3.How can we help you reach that goal?   Record the patient's initial score and the patient's pain goal.   Pain: 8  Pain Goal: 3 [facility] wants you to be able to say your pain was always managed very well.  Maria [name],Maria [name] 01/26/2016

## 2016-01-26 NOTE — MAU Note (Signed)
Leaking, fluid still coming. Puddle of clear fluid noted in chair. No bleeding. No contractions.  Denies problem with preg

## 2016-01-26 NOTE — Telephone Encounter (Signed)
Pt's step-daughter called and stated that the patient is having swelling in feet and is having abdominal pain and "leaking fluid with blood in it since yesterday".  Pt is 38 wks and states is having contractions every 10 minutes that last about 30 - 40 seconds, reports good FM and a red watery DC.  [facility].  Pt verbalized understanding.

## 2016-01-26 NOTE — H&P (Signed)
LABOR AND DELIVERY ADMISSION HISTORY AND PHYSICAL NOTE  Maria [name] is a 25 y.o. female G1P0 with IUP at 7058w1d by 7 wk US presenting for SROM today at 1440. Her contractions had started overnight, but she noticed that her contractions became stronger around 0600.  She reports positive fetal movement. She denies vaginal bleeding.  She denies headaches, blurred vision, and RUQ/epigastric pain.  Prenatal History/Complications: None  Past Medical History: Past Medical History  Diagnosis Date  . Amenorrhea 03/25/2013  . Patient desires pregnancy 04/15/2013  . Pregnant 06/15/2015  . Medical history non-contributory     Past Surgical History: Past Surgical History  Procedure Laterality Date  . No past surgeries      Obstetrical History: OB History    Gravida Para Term Preterm AB TAB SAB Ectopic Multiple Living   1               Social History: Social History   Social History  . Marital Status: Married    Spouse Name: N/A  . Number of Children: N/A  . Years of Education: N/A   Social History Main Topics  . Smoking status: Never Smoker   . Smokeless tobacco: Never Used  . Alcohol Use: Yes     Comment: once, twice a year  . Drug Use: No  . Sexual Activity: Yes    Birth Control/ Protection: None   Other Topics Concern  . None   Social History Narrative    Family History: Family History  Problem Relation Age of Onset  . Cancer Mother 4236    breast  . Asthma Neg Hx   . Diabetes Neg Hx   . Heart disease Neg Hx   . Hypertension Neg Hx   . Stroke Neg Hx     Allergies: No Known Allergies  Prescriptions prior to admission  Medication Sig Dispense Refill Last Dose  . Prenat w/o A-FeCbGl-DSS-FA-DHA (CITRANATAL DHA) 27-1 & 250 MG tablet Take 1 tablet by mouth daily. 30 tablet 11 Taking     Review of Systems   All systems reviewed and negative except as stated in HPI  Blood pressure 140/76, pulse 105, temperature 99.1 F (37.3 C), temperature source Oral,  resp. rate 18, last menstrual period 05/04/2015. General appearance: alert, cooperative and moderate distress Lungs: clear to auscultation bilaterally Heart: regular rate and rhythm Abdomen: soft, non-tender; bowel sounds normal Extremities: No calf swelling or tenderness Presentation: cephalic by exam Fetal monitoring: 150 bpm, moderate variability, no accelerations, no decelerations Uterine activity: Moderate contractions every 2-3 minutes Dilation: 5 Effacement (%): 90 Station: -1 Exam by:: jolynn   Prenatal labs: ABO, Rh: O/Positive/-- (10/05 1646) Antibody: Negative (10/05 1646) Rubella: !Error! RPR: Non Reactive (10/05 1646)  HBsAg: Negative (10/05 1646)  HIV: Non Reactive (10/05 1646)  GBS: Negative (04/25 1655)  1 hr Glucola: Passed Genetic screening:  Declined Anatomy US: Normal  Prenatal Transfer Tool  Maternal Diabetes: No Genetic Screening: Declined Maternal Ultrasounds/Referrals: Normal Fetal Ultrasounds or other Referrals:  None Maternal Substance Abuse:  No Significant Maternal Medications:  None Significant Maternal Lab Results: Lab values include: Group B Strep negative  No results found for this or any previous visit (from the past 24 hour(s)).  Patient Active Problem List   Diagnosis Date Noted  . Encounter for supervision of normal first pregnancy in first trimester 07/01/2015  . Pregnant 06/15/2015  . Family history of breast cancer in mother 08/20/2014  . Mastalgia in female 08/20/2014    Assessment: Maria [name] is  a 25 y.o. G1P0 at [redacted]w[redacted]d here for SROM and regular contractions.  #Labor: Spontaneous, expectant management #Pain: Would like to try to labor without epidural, but is open to one later on. #FWB: Category I #ID: GBS neg #MOF: Bottle #MOC: Undecided #Circ: Inpatient  Jinny Blossom Mayo 01/26/2016, 3:22 PM   OB fellow attestation: I have seen and examined this patient; I agree with above documentation in the resident's note.    [name] is a 25 y.o. G1P0 here for active labor, PROM  PE: BP 140/76 mmHg  Pulse 105  Temp(Src) 98.7 F (37.1 C) (Oral)  Resp 18  Ht  (1.6 m)  Wt 133 lb (60.328 kg)  BMI 23.57 kg/m2  LMP 05/04/2015 Gen: calm comfortable, NAD Resp: normal effort, no distress Abd: gravid  ROS, labs, PMH reviewed  Plan: Admit to LD Labor: Expectant management  FWB: Cat I  ID: GBS Neg   Federico Flake, MD  Family Medicine, OB Fellow 01/26/2016, 4:33 PM

## 2016-01-27 DIAGNOSIS — Z029 Encounter for administrative examinations, unspecified: Secondary | ICD-10-CM

## 2016-01-27 LAB — RPR: RPR: NONREACTIVE

## 2016-01-27 NOTE — Progress Notes (Signed)
POSTPARTUM PROGRESS NOTE  Post Partum Day 1 Subjective:  Maria [name] is a 25 y.o. G1P1001 3349w1d s/p SVD.  No acute events overnight.  Pt denies problems with ambulating, voiding or po intake.  She denies nausea or vomiting.  Pain is moderately controlled.  She has had flatus. She has not had bowel movement.  Lochia Small.   Objective: Blood pressure 90/52, pulse 85, temperature 98.1 F (36.7 C), temperature source Oral, resp. rate 18, height 5\' 3"  (1.6 m), weight 133 lb (60.328 kg), last menstrual period 05/04/2015, unknown if currently breastfeeding.  Physical Exam:  General: alert, cooperative and no distress Lochia:normal flow Chest: CTAB Heart: RRR no m/r/g Abdomen: +BS, soft, nontender,  Uterine Fundus: firm, below the umbilicus DVT Evaluation: No calf swelling or tenderness Extremities: no edema   Recent Labs  01/26/16 1540  HGB 15.1*  HCT 42.3    Assessment/Plan:  ASSESSMENT: Maria [name] is a 25 y.o. G1P1001 3649w1d s/p SVD.  Plan for discharge tomorrow   LOS: 1 day   Hilton SinclairKaty D Jadarious Dobbins 01/27/2016, 7:39 AM

## 2016-01-28 MED ORDER — IBUPROFEN 600 MG PO TABS
600.0000 mg | ORAL_TABLET | Freq: Four times a day (QID) | ORAL | Status: DC
Start: 2016-01-28 — End: 2016-03-07

## 2016-01-28 NOTE — Discharge Summary (Signed)
OB Discharge Summary  Patient Name: Maria [name] DOB: 1991-05-17 MRN: [id number]  Date of admission: 01/26/2016 Delivering MD: Willadean Carol [name]   Date of discharge: 01/28/2016  Admitting diagnosis: 38WKS,WATER BROKE Intrauterine pregnancy: [redacted]w[redacted]d     Secondary diagnosis:Principal Problem:   NSVD (normal spontaneous vaginal delivery)  Additional problems: None    Discharge diagnosis: Term Pregnancy Delivered                                                                     Post partum procedures:none  Augmentation: none  Complications: None  Hospital course:  Onset of Labor With Vaginal Delivery     25 y.o. yo G1P1001 at [redacted]w[redacted]d was admitted in Active Labor on 01/26/2016. Patient had an uncomplicated labor course as follows:  Membrane Rupture Time/Date: 2:20 PM ,01/26/2016   Intrapartum Procedures: Episiotomy: None [1]                                         Lacerations:  1st degree [2];Perineal [11]  Patient had a delivery of a Viable infant. 01/26/2016  Information for the patient's newborn:  Niobe, Dick [[id number]]  Delivery Method: Vaginal, Spontaneous Delivery (Filed from Delivery Summary)    Pateint had an uncomplicated postpartum course.  She is ambulating, tolerating a regular diet, passing flatus, and urinating well. Patient is discharged home in stable condition on 01/28/2016.    Physical exam  Filed Vitals:   01/27/16 0140 01/27/16 0630 01/27/16 1748 01/28/16 0550  BP: 99/66 90/52 100/65 95/53  Pulse: 78 85 84   Temp: 98.2 F (36.8 C) 98.1 F (36.7 C) 97.8 F (36.6 C) 97.8 F (36.6 C)  TempSrc: Oral Oral Oral Oral  Resp: Height:      Weight:       General: alert, cooperative and no distress Lochia: appropriate Uterine Fundus: firm Incision: N/A DVT Evaluation: No evidence of DVT seen on physical exam. Labs: Lab Results  Component Value Date   WBC 15.7* 01/26/2016   HGB 15.1* 01/26/2016   HCT 42.3 01/26/2016   MCV 88.3  01/26/2016   PLT 276 01/26/2016   CMP Latest Ref Rng 08/20/2014  Glucose 70 - 99 mg/dL 93  BUN 6 - 23 mg/dL 8  Creatinine 7.82 - 9.56 mg/dL 2.13  Sodium 086 - 578 mEq/L 140  Potassium 3.5 - 5.3 mEq/L 3.9  Chloride 96 - 112 mEq/L 103  CO2 19 - 32 mEq/L 23  Calcium 8.4 - 10.5 mg/dL 9.4  Total Protein 6.0 - 8.3 g/dL 7.4  Total Bilirubin 0.2 - 1.2 mg/dL 0.5  Alkaline Phos 39 - 117 U/L 56  AST 0 - 37 U/L 20  ALT 0 - 35 U/L 16    Discharge instruction: per After Visit Summary and "Baby and Me Booklet".  After Visit Meds:    Medication List    TAKE these medications        CITRANATAL DHA 27-1 & 250 MG tablet  Take 1 tablet by mouth daily.     ibuprofen 600 MG tablet  Commonly known as:  ADVIL,MOTRIN  Take 1 tablet (  600 mg total) by mouth every 6 (six) hours.        Diet: routine diet  Activity: Advance as tolerated. Pelvic rest for 6 weeks.   Outpatient follow up:4 weeks Follow up Appt:No future appointments. Follow up visit: No Follow-up on file.  Postpartum contraception: Undecided  Newborn Data: Live born female  Birth Weight: 5 lb 11.9 oz (2605 g) APGAR: 8, 9  Baby Feeding: Bottle Disposition:home with mother   01/28/2016 Hilton SinclairKaty D Mayo, MD   CNM attestation I have seen and examined this patient and agree with above documentation in the resident's note.   Einar GradDulce Leonette MonarchMendoza is a 25 y.o. G1P1001 s/p SVD.   Pain is well controlled.  Plan for birth control is undecided.  Method of Feeding: bottle  PE:  BP 95/53 mmHg  Pulse 84  Temp(Src) 97.8 F (36.6 C) (Oral)  Resp 18  Ht 5\' 3"  (1.6 m)  Wt 60.328 kg (133 lb)  BMI 23.57 kg/m2  LMP 05/04/2015  Breastfeeding? Unknown Fundus firm   Recent Labs  01/26/16 1540  HGB 15.1*  HCT 42.3     Plan: discharge today - postpartum care discussed - f/u clinic in 6 weeks for postpartum visit   Collyn Ribas, CNM 12:00 PM

## 2016-01-28 NOTE — Discharge Instructions (Signed)

## 2016-02-25 ENCOUNTER — Ambulatory Visit: Payer: BLUE CROSS/BLUE SHIELD | Admitting: Advanced Practice Midwife

## 2016-03-07 ENCOUNTER — Ambulatory Visit (INDEPENDENT_AMBULATORY_CARE_PROVIDER_SITE_OTHER): Payer: BLUE CROSS/BLUE SHIELD | Admitting: Adult Health

## 2016-03-07 ENCOUNTER — Encounter: Payer: Self-pay | Admitting: Adult Health

## 2016-03-07 DIAGNOSIS — Z309 Encounter for contraceptive management, unspecified: Secondary | ICD-10-CM | POA: Insufficient documentation

## 2016-03-07 DIAGNOSIS — Z1331 Encounter for screening for depression: Secondary | ICD-10-CM

## 2016-03-07 DIAGNOSIS — Z3202 Encounter for pregnancy test, result negative: Secondary | ICD-10-CM | POA: Diagnosis not present

## 2016-03-07 DIAGNOSIS — Z30011 Encounter for initial prescription of contraceptive pills: Secondary | ICD-10-CM

## 2016-03-07 HISTORY — DX: Encounter for contraceptive management, unspecified: Z30.9

## 2016-03-07 HISTORY — DX: Encounter for screening for depression: Z13.31

## 2016-03-07 LAB — POCT URINE PREGNANCY: Preg Test, Ur: NEGATIVE

## 2016-03-07 MED ORDER — NORETHINDRONE 0.35 MG PO TABS
1.0000 | ORAL_TABLET | Freq: Every day | ORAL | Status: DC
Start: 2016-03-07 — End: 2017-03-08

## 2016-03-07 NOTE — Patient Instructions (Signed)
Start micronor now, use condoms for at least 1 pack of pills Call when stops pumping if wants other birth control pill Physical in 1 year

## 2016-03-07 NOTE — Progress Notes (Signed)
Patient ID: Maria EvenerDulce [name], female   DOB: 03/21/1991, 25 y.o.   MRN: [id number] Maria GradDulce is a 25 year old Hispanic female in for postpartum visit.She delivered at 38+1 weeks, and had 1st degree laceration with repair.  Delivery Date:01/26/16  Method of Delivery: Vaginal delivery baby boy andrew 5 lbs 11 oz  Sexual Activity since delivery: No  Method of Feeding: Pumping  Number of weeks bleeding post delivery: Still spotting  Review of Systems: Patient denies any headaches, hearing loss, fatigue, blurred vision, shortness of breath, chest pain, abdominal pain, problems with bowel movements, urination, or intercourse(not yet). No joint pain or mood swings.Has pain in lower back,near crack, at times and legs swell some.  Reviewed past medical,surgical, social and family history. Reviewed medications and allergies.   Depression Score: 0  BP 116/70 mmHg  Pulse 72  Ht 5\' 3"  (1.6 m)  Wt 119 lb 8 oz (54.205 kg)  BMI 21.17 kg/m2  Breastfeeding? Yes UPT negative Skin warm and dry. Neck: mid line trachea, normal thyroid, good ROM, no lymphadenopathy noted. Lungs: clear to ausculation bilaterally. Cardiovascular: regular rate and rhythm. Pelvic Exam:   External genitalia is normal in appearance, no lesions.  The vagina has good color, moisture and rugae, no lesions,+period like blood.Urethra has no masses or tenderness noted. The cervix is bulbous.  Uterus is felt to be normal size, shape, and contour, well involuted.  No adnexal masses or tenderness noted.Bladder is non tender, no masses felt.   Impression:  Status post delivery, post partum check, depression screening, contraceptive management.   Plan:   Rx micronor disp 1 pack take 1 daily with 11 refills Use condoms for 1 pack Call when stops pumping for COC Given handout on nexplanon Return in 1 year for pap and physical or before if back still aches

## 2017-02-08 ENCOUNTER — Other Ambulatory Visit: Payer: Self-pay | Admitting: Adult Health

## 2017-03-08 ENCOUNTER — Other Ambulatory Visit: Payer: Self-pay | Admitting: Adult Health

## 2017-03-08 ENCOUNTER — Encounter: Payer: Self-pay | Admitting: Adult Health

## 2017-03-08 ENCOUNTER — Ambulatory Visit (INDEPENDENT_AMBULATORY_CARE_PROVIDER_SITE_OTHER): Payer: BLUE CROSS/BLUE SHIELD | Admitting: Adult Health

## 2017-03-08 ENCOUNTER — Other Ambulatory Visit (HOSPITAL_COMMUNITY)
Admission: RE | Admit: 2017-03-08 | Discharge: 2017-03-08 | Disposition: A | Discharge status: Home / Self Care | Payer: BLUE CROSS/BLUE SHIELD | Source: Ambulatory Visit | Attending: Adult Health | Admitting: Adult Health

## 2017-03-08 VITALS — BP 118/68 | HR 65 | Ht 61.0 in | Wt 129.0 lb

## 2017-03-08 DIAGNOSIS — Z01419 Encounter for gynecological examination (general) (routine) without abnormal findings: Secondary | ICD-10-CM | POA: Diagnosis not present

## 2017-03-08 DIAGNOSIS — Z803 Family history of malignant neoplasm of breast: Secondary | ICD-10-CM

## 2017-03-08 DIAGNOSIS — Z3041 Encounter for surveillance of contraceptive pills: Secondary | ICD-10-CM | POA: Diagnosis not present

## 2017-03-08 DIAGNOSIS — Z1231 Encounter for screening mammogram for malignant neoplasm of breast: Secondary | ICD-10-CM

## 2017-03-08 MED ORDER — NORETHIN-ETH ESTRADIOL-FE 0.8-25 MG-MCG PO CHEW: 1.0000 | CHEWABLE_TABLET | Freq: Every day | ORAL | 12 refills | Status: DC

## 2017-03-08 NOTE — Progress Notes (Signed)
Patient ID: Maria [name], female   DOB: 03/21/1991, 26 y.o.   MRN: [id number] History of Present Illness: Maria [name] is a 26 year old Hispanic female, G1P1, married in for well woman gyn exam and pap.    Current Medications, Allergies, Past Medical History, Past Surgical History, Family History and Social History were reviewed in [facility] electronic medical record.     Review of Systems: Patient denies any daily headaches(occasional) , hearing loss, fatigue, blurred vision, shortness of breath, chest pain, abdominal pain, problems with bowel movements, urination, or intercourse(some discomfort at times). No joint pain or mood swings. She is happy with OCs.   Physical Exam:BP 118/68 (BP Location: Right Arm, Patient Position: Sitting, Cuff Size: Normal)   Pulse 65   Ht 5\' 1"  (1.549 m)   Wt 129 lb (58.5 kg)   LMP 02/14/2017   Breastfeeding? No   BMI 24.37 kg/m  General:  Well developed, well nourished, no acute distress Skin:  Warm and dry Neck:  Midline trachea, normal thyroid, good ROM, no lymphadenopathy Lungs; Clear to auscultation bilaterally Breast:  No dominant palpable mass, retraction, or nipple discharge Cardiovascular: Regular rate and rhythm Abdomen:  Soft, non tender, no hepatosplenomegaly Pelvic:  External genitalia is normal in appearance, no lesions.  The vagina is normal in appearance. Urethra has no lesions or masses. The cervix is bulbous,pap with GC/CHL and reflex HPV performed.  Uterus is felt to be normal size, shape, and contour.  No adnexal masses or tenderness noted.Bladder is non tender, no masses felt. Extremities/musculoskeletal:  No swelling or varicosities noted, no clubbing or cyanosis Psych:  No mood changes, alert and cooperative,seems happy PHQ 2 score 0. Her mom had breast cancer at 3537, will get mammogram scheduled for her in July.  Impression:  1. Papanicolaou smear, as part of routine gynecological examination   2. Encounter for surveillance of  contraceptive pills   3. Family history of breast cancer in mother      Plan: [facility] ordered this encounter  Medications  . Norethindrone-Ethinyl Estradiol-Fe (GENERESSE) 0.8-25 MG-MCG tablet    Sig: Chew 1 tablet by mouth daily.    Dispense:  28 tablet    Refill:  12    Order Specific Question:   Supervising Provider    Answer:   Lazaro ArmsEURE, LUTHER H [[id number]]  Physical in 1 year, pap in 3 years if normal  Try astro glide or KY jelly with sex Take 2 Extra strength tylenol and drink with caffeine at start of headache, let me know if helps or not.

## 2017-03-14 LAB — CYTOLOGY - PAP
CHLAMYDIA, DNA PROBE: NEGATIVE
Diagnosis: NEGATIVE
NEISSERIA GONORRHEA: NEGATIVE

## 2017-03-27 ENCOUNTER — Encounter (HOSPITAL_COMMUNITY): Payer: Self-pay

## 2017-03-27 ENCOUNTER — Ambulatory Visit (HOSPITAL_COMMUNITY)
Admission: RE | Admit: 2017-03-27 | Discharge: 2017-03-27 | Disposition: A | Discharge status: Home / Self Care | Payer: BLUE CROSS/BLUE SHIELD | Source: Ambulatory Visit | Attending: Adult Health | Admitting: Adult Health

## 2017-03-27 ENCOUNTER — Ambulatory Visit (HOSPITAL_COMMUNITY): Payer: BLUE CROSS/BLUE SHIELD

## 2017-03-27 DIAGNOSIS — Z1231 Encounter for screening mammogram for malignant neoplasm of breast: Secondary | ICD-10-CM

## 2018-02-14 ENCOUNTER — Other Ambulatory Visit: Payer: Self-pay | Admitting: Adult Health

## 2018-02-14 DIAGNOSIS — Z1231 Encounter for screening mammogram for malignant neoplasm of breast: Secondary | ICD-10-CM

## 2018-03-26 ENCOUNTER — Ambulatory Visit: Payer: BLUE CROSS/BLUE SHIELD | Admitting: Adult Health

## 2018-03-26 ENCOUNTER — Encounter: Payer: Self-pay | Admitting: Adult Health

## 2018-03-26 VITALS — BP 120/66 | HR 82 | Ht 63.0 in | Wt 120.0 lb

## 2018-03-26 DIAGNOSIS — Z3041 Encounter for surveillance of contraceptive pills: Secondary | ICD-10-CM | POA: Diagnosis not present

## 2018-03-26 DIAGNOSIS — Z01419 Encounter for gynecological examination (general) (routine) without abnormal findings: Secondary | ICD-10-CM | POA: Diagnosis not present

## 2018-03-26 DIAGNOSIS — Z803 Family history of malignant neoplasm of breast: Secondary | ICD-10-CM | POA: Diagnosis not present

## 2018-03-26 MED ORDER — NORETHIN-ETH ESTRADIOL-FE 0.8-25 MG-MCG PO CHEW: 1.0000 | CHEWABLE_TABLET | Freq: Every day | ORAL | 12 refills | Status: DC

## 2018-03-26 NOTE — Progress Notes (Signed)
Patient ID: Virgina EvenerDulce Cartwright, female   DOB: 03/21/1991, 27 y.o.   MRN: [id number] History of Present Illness:  Einar GradDulce is a 27 year old Hispanic female, married G1P1 in for well woman gyn exam, she had normal pap 03/08/17. She is happy with her OCs, but may decide to stop next year to get pregnant.  Current Medications, Allergies, Past Medical History, Past Surgical History, Family History and Social History were reviewed in [facility] electronic medical record.     Review of Systems: Patient denies any headaches, hearing loss, fatigue, blurred vision, shortness of breath, chest pain, abdominal pain, problems with bowel movements, urination, or intercourse. No joint pain or mood swings.    Physical Exam:BP 120/66 (BP Location: Left Arm, Patient Position: Sitting, Cuff Size: Small)   Pulse 82   Ht 5\' 3"  (1.6 m)   Wt 120 lb (54.4 kg)   LMP 03/13/2018   BMI 21.26 kg/m  General:  Well developed, well nourished, no acute distress Skin:  Warm and dry Neck:  Midline trachea, normal thyroid, good ROM, no lymphadenopathy Lungs; Clear to auscultation bilaterally Breast:  No dominant palpable mass, retraction, or nipple discharge Cardiovascular: Regular rate and rhythm Abdomen:  Soft, non tender, no hepatosplenomegaly Pelvic:  External genitalia is normal in appearance, no lesions.  The vagina is normal in appearance. Urethra has no lesions or masses. The cervix is bulbous.  Uterus is felt to be normal size, shape, and contour.  No adnexal masses or tenderness noted.Bladder is non tender, no masses felt. Extremities/musculoskeletal:  No swelling or varicosities noted, no clubbing or cyanosis Psych:  No mood changes, alert and cooperative,seems happy PHQ 9 score 0.  Impression: 1. Encounter for well woman exam with routine gynecological exam   2. Encounter for surveillance of contraceptive pills   3. Family history of breast cancer in mother       Plan: Meds ordered this encounter   Medications  . Norethindrone-Ethinyl Estradiol-Fe (GENERESSE) 0.8-25 MG-MCG tablet    Sig: Chew 1 tablet by mouth daily.    Dispense:  28 tablet    Refill:  12    Order Specific Question:   Supervising Provider    Answer:   Lazaro ArmsEURE, LUTHER H [[id number]]  Physical in 1 year Pap in 2021 Get mammogram as scheduled 04/02/18  Take OTC PNV when stops OCs

## 2018-03-26 NOTE — Progress Notes (Signed)
Patient ID: Maria EvenerDulce [name], female   DOB: 03/21/1991, 27 y.o.   MRN: [id number]

## 2018-04-02 ENCOUNTER — Ambulatory Visit (HOSPITAL_COMMUNITY)
Admission: RE | Admit: 2018-04-02 | Discharge: 2018-04-02 | Disposition: A | Discharge status: Home / Self Care | Payer: BLUE CROSS/BLUE SHIELD | Source: Ambulatory Visit | Attending: Adult Health | Admitting: Adult Health

## 2018-04-02 ENCOUNTER — Encounter (HOSPITAL_COMMUNITY): Payer: Self-pay

## 2018-04-02 DIAGNOSIS — Z1231 Encounter for screening mammogram for malignant neoplasm of breast: Secondary | ICD-10-CM

## 2019-04-04 ENCOUNTER — Telehealth: Payer: Self-pay | Admitting: Adult Health

## 2019-04-04 MED ORDER — NORETHIN-ETH ESTRADIOL-FE 0.8-25 MG-MCG PO CHEW: 1.0000 | CHEWABLE_TABLET | Freq: Every day | ORAL | 12 refills | Status: DC

## 2019-04-04 NOTE — Addendum Note (Signed)
Addended by: Derrek Monaco A on: 04/04/2019 01:50 PM   Modules accepted: Orders

## 2019-04-04 NOTE — Telephone Encounter (Signed)
Refilled OCs 

## 2019-04-04 NOTE — Telephone Encounter (Signed)
Patient called stating that she needs a refill of her medication Estradiol. Pt states that she uses [facility]. Please contact pt

## 2019-04-04 NOTE — Telephone Encounter (Signed)
Pt is requesting a refill on her birth control. Thanks!! JSY 

## 2019-09-23 ENCOUNTER — Other Ambulatory Visit: Payer: BLUE CROSS/BLUE SHIELD | Admitting: Women's Health

## 2020-07-02 ENCOUNTER — Ambulatory Visit (INDEPENDENT_AMBULATORY_CARE_PROVIDER_SITE_OTHER): Payer: Self-pay | Admitting: *Deleted

## 2020-07-02 ENCOUNTER — Other Ambulatory Visit: Payer: Self-pay

## 2020-07-02 ENCOUNTER — Encounter: Payer: Self-pay | Admitting: *Deleted

## 2020-07-02 VITALS — BP 132/81 | HR 120 | Ht 63.0 in | Wt 125.0 lb

## 2020-07-02 DIAGNOSIS — N926 Irregular menstruation, unspecified: Secondary | ICD-10-CM

## 2020-07-02 DIAGNOSIS — Z3201 Encounter for pregnancy test, result positive: Secondary | ICD-10-CM

## 2020-07-02 DIAGNOSIS — O209 Hemorrhage in early pregnancy, unspecified: Secondary | ICD-10-CM

## 2020-07-02 NOTE — Progress Notes (Signed)
   NURSE VISIT- PREGNANCY CONFIRMATION   SUBJECTIVE:  Maria [name] is a 29 y.o. G67P1001 female  by certain LMP of Patient's last menstrual period was 05/07/2020. Here for pregnancy confirmation.  Home pregnancy test: positive x 1.  She reports cramping and heavy bleeding with clots intermittently for the past couple of weeks and is currently bleeding.  Unable to collect urine specimen.  She is not taking prenatal vitamins.    OBJECTIVE:  BP 132/81 (BP Location: Left Arm, Patient Position: Sitting, Cuff Size: Normal)   Pulse (!) 120   Ht 5\' 3"  (1.6 m)   Wt 125 lb (56.7 kg)   LMP 05/07/2020   BMI 22.14 kg/m   Appears well, in no apparent distress OB History  Gravida Para Term Preterm AB Living  1 1 1     1   SAB TAB Ectopic Multiple Live Births        0 1    # Outcome Date GA Lbr Len/2nd Weight Sex Delivery Anes PTL Lv  1 Term 01/26/16 [redacted]w[redacted]d 01:37 / 00:29 5 lb 11.9 oz (2.605 kg) M Vag-Spont Local  LIV     Birth Comments: skin tag on left ear    No results found for this or any previous visit (from the past 24 hour(s)).  ASSESSMENT: Positive pregnancy test, certain LMP 05/07/20  PLAN: HCG today. ABO not needed as patient is O positive.  Nausea medicines: not currently needed   OB packet given: No  [redacted]w[redacted]d  07/02/2020 12:06 PM

## 2020-07-02 NOTE — Progress Notes (Signed)
Chart reviewed for nurse visit. Agree with plan of care. Check Felecia Shelling, NP 07/02/2020 1:54 PM

## 2020-07-03 ENCOUNTER — Telehealth: Payer: Self-pay | Admitting: *Deleted

## 2020-07-03 LAB — BETA HCG QUANT (REF LAB): hCG Quant: 105417 m[IU]/mL

## 2020-07-03 NOTE — Telephone Encounter (Signed)
Called patient to inform of HCG result.  Spouse states she continued to have bleeding and pain yesterday and it has slowed down some.  Informed HCG shows about 7 weeks but HCG on Monday would give [place] more information.  Verbalized understanding.

## 2020-07-06 ENCOUNTER — Other Ambulatory Visit: Payer: Self-pay

## 2020-07-06 DIAGNOSIS — O209 Hemorrhage in early pregnancy, unspecified: Secondary | ICD-10-CM

## 2020-07-07 ENCOUNTER — Telehealth: Payer: Self-pay | Admitting: *Deleted

## 2020-07-07 LAB — BETA HCG QUANT (REF LAB): hCG Quant: 108246 m[IU]/mL

## 2020-07-07 NOTE — Telephone Encounter (Signed)
Patient informed that labs are not dropping and we need to get ultrasound in our office.  Will come Friday at 12:45. Verbalized understanding.

## 2020-07-09 ENCOUNTER — Other Ambulatory Visit: Payer: Self-pay | Admitting: Obstetrics & Gynecology

## 2020-07-09 DIAGNOSIS — O3680X0 Pregnancy with inconclusive fetal viability, not applicable or unspecified: Secondary | ICD-10-CM

## 2020-07-10 ENCOUNTER — Encounter: Payer: Self-pay | Admitting: Women's Health

## 2020-07-10 ENCOUNTER — Ambulatory Visit (HOSPITAL_COMMUNITY): Payer: Self-pay | Admitting: Anesthesiology

## 2020-07-10 ENCOUNTER — Ambulatory Visit (HOSPITAL_COMMUNITY)
Admission: AD | Admit: 2020-07-10 | Discharge: 2020-07-10 | Disposition: A | Discharge status: Home / Self Care | Payer: Self-pay | Attending: Obstetrics & Gynecology | Admitting: Obstetrics & Gynecology

## 2020-07-10 ENCOUNTER — Encounter (HOSPITAL_COMMUNITY)
Admission: AD | Disposition: A | Discharge status: Home / Self Care | Payer: Self-pay | Source: Home / Self Care | Attending: Obstetrics & Gynecology

## 2020-07-10 ENCOUNTER — Other Ambulatory Visit: Payer: Self-pay | Admitting: Obstetrics and Gynecology

## 2020-07-10 ENCOUNTER — Other Ambulatory Visit: Payer: Self-pay

## 2020-07-10 ENCOUNTER — Ambulatory Visit (INDEPENDENT_AMBULATORY_CARE_PROVIDER_SITE_OTHER): Payer: Self-pay | Admitting: Women's Health

## 2020-07-10 ENCOUNTER — Encounter (HOSPITAL_COMMUNITY): Payer: Self-pay | Admitting: Obstetrics & Gynecology

## 2020-07-10 ENCOUNTER — Other Ambulatory Visit: Payer: Self-pay | Admitting: Obstetrics & Gynecology

## 2020-07-10 ENCOUNTER — Ambulatory Visit (INDEPENDENT_AMBULATORY_CARE_PROVIDER_SITE_OTHER): Payer: Self-pay

## 2020-07-10 VITALS — BP 130/79 | HR 142 | Wt 125.0 lb

## 2020-07-10 DIAGNOSIS — O3680X0 Pregnancy with inconclusive fetal viability, not applicable or unspecified: Secondary | ICD-10-CM

## 2020-07-10 DIAGNOSIS — N939 Abnormal uterine and vaginal bleeding, unspecified: Secondary | ICD-10-CM

## 2020-07-10 DIAGNOSIS — Z3A09 9 weeks gestation of pregnancy: Secondary | ICD-10-CM

## 2020-07-10 DIAGNOSIS — Z20822 Contact with and (suspected) exposure to covid-19: Secondary | ICD-10-CM | POA: Insufficient documentation

## 2020-07-10 DIAGNOSIS — O021 Missed abortion: Secondary | ICD-10-CM

## 2020-07-10 HISTORY — PX: DILATION AND EVACUATION: SHX1459

## 2020-07-10 LAB — CBC
HCT: 33 % — ABNORMAL LOW (ref 36.0–46.0)
Hemoglobin: 10.6 g/dL — ABNORMAL LOW (ref 12.0–15.0)
MCH: 29.5 pg (ref 26.0–34.0)
MCHC: 32.1 g/dL (ref 30.0–36.0)
MCV: 91.9 fL (ref 80.0–100.0)
Platelets: 297 10*3/uL (ref 150–400)
RBC: 3.59 MIL/uL — ABNORMAL LOW (ref 3.87–5.11)
RDW: 14 % (ref 11.5–15.5)
WBC: 11.3 10*3/uL — ABNORMAL HIGH (ref 4.0–10.5)
nRBC: 0 % (ref 0.0–0.2)

## 2020-07-10 LAB — POCT HEMOGLOBIN: Hemoglobin: 10.8 g/dL — AB (ref 11–14.6)

## 2020-07-10 LAB — TYPE AND SCREEN
ABO/RH(D): O POS
Antibody Screen: NEGATIVE

## 2020-07-10 LAB — SARS CORONAVIRUS 2 BY RT PCR (HOSPITAL ORDER, PERFORMED IN ~~LOC~~ HOSPITAL LAB): SARS Coronavirus 2: NEGATIVE

## 2020-07-10 SURGERY — DILATION AND EVACUATION, UTERUS
Anesthesia: General

## 2020-07-10 MED ORDER — METHYLERGONOVINE MALEATE 0.2 MG/ML IJ SOLN
INTRAMUSCULAR | Status: DC | PRN
  Administered 2020-07-10: .2 mg via INTRAMUSCULAR

## 2020-07-10 MED ORDER — FENTANYL CITRATE (PF) 100 MCG/2ML IJ SOLN
25.0000 ug | INTRAMUSCULAR | Status: DC | PRN
  Administered 2020-07-10 (×2): 25 ug via INTRAVENOUS
  Administered 2020-07-10: 50 ug via INTRAVENOUS

## 2020-07-10 MED ORDER — KETOROLAC TROMETHAMINE 10 MG PO TABS: 10.0000 mg | ORAL_TABLET | Freq: Three times a day (TID) | ORAL | 0 refills | Status: DC | PRN

## 2020-07-10 MED ORDER — DOXYCYCLINE HYCLATE 100 MG IV SOLR
200.0000 mg | INTRAVENOUS | Status: AC
  Administered 2020-07-10: 200 mg via INTRAVENOUS
  Filled 2020-07-10: qty 200

## 2020-07-10 MED ORDER — SCOPOLAMINE 1 MG/3DAYS TD PT72
MEDICATED_PATCH | TRANSDERMAL | Status: AC
  Filled 2020-07-10: qty 1

## 2020-07-10 MED ORDER — FENTANYL CITRATE (PF) 100 MCG/2ML IJ SOLN
INTRAMUSCULAR | Status: DC | PRN
  Administered 2020-07-10: 150 ug via INTRAVENOUS

## 2020-07-10 MED ORDER — SUCCINYLCHOLINE CHLORIDE 20 MG/ML IJ SOLN
INTRAMUSCULAR | Status: DC | PRN
  Administered 2020-07-10: 100 mg via INTRAVENOUS

## 2020-07-10 MED ORDER — SCOPOLAMINE 1 MG/3DAYS TD PT72
MEDICATED_PATCH | TRANSDERMAL | Status: DC | PRN
  Administered 2020-07-10: 1 via TRANSDERMAL

## 2020-07-10 MED ORDER — BUPIVACAINE HCL (PF) 0.5 % IJ SOLN
INTRAMUSCULAR | Status: AC
  Filled 2020-07-10: qty 30

## 2020-07-10 MED ORDER — DEXAMETHASONE SODIUM PHOSPHATE 10 MG/ML IJ SOLN
INTRAMUSCULAR | Status: DC | PRN
  Administered 2020-07-10: 10 mg via INTRAVENOUS

## 2020-07-10 MED ORDER — ACETAMINOPHEN 10 MG/ML IV SOLN: 1000.0000 mg | Freq: Once | INTRAVENOUS | Status: DC | PRN

## 2020-07-10 MED ORDER — LACTATED RINGERS IV SOLN: INTRAVENOUS | Status: DC

## 2020-07-10 MED ORDER — SOD CITRATE-CITRIC ACID 500-334 MG/5ML PO SOLN
30.0000 mL | ORAL | Status: AC
  Administered 2020-07-10: 30 mL via ORAL
  Filled 2020-07-10: qty 15

## 2020-07-10 MED ORDER — POVIDONE-IODINE 10 % EX SWAB
2.0000 "application " | Freq: Once | CUTANEOUS | Status: AC
  Administered 2020-07-10: 2 via TOPICAL

## 2020-07-10 MED ORDER — KETOROLAC TROMETHAMINE 15 MG/ML IJ SOLN
15.0000 mg | INTRAMUSCULAR | Status: AC
  Administered 2020-07-10: 15 mg via INTRAVENOUS
  Filled 2020-07-10: qty 1

## 2020-07-10 MED ORDER — LIDOCAINE 2% (20 MG/ML) 5 ML SYRINGE
INTRAMUSCULAR | Status: DC | PRN
  Administered 2020-07-10: 50 mg via INTRAVENOUS

## 2020-07-10 MED ORDER — 0.9 % SODIUM CHLORIDE (POUR BTL) OPTIME
TOPICAL | Status: DC | PRN
  Administered 2020-07-10: 1000 mL

## 2020-07-10 MED ORDER — FENTANYL CITRATE (PF) 100 MCG/2ML IJ SOLN
INTRAMUSCULAR | Status: AC
  Filled 2020-07-10: qty 2

## 2020-07-10 MED ORDER — MEPERIDINE HCL 25 MG/ML IJ SOLN: 6.2500 mg | INTRAMUSCULAR | Status: DC | PRN

## 2020-07-10 MED ORDER — MIDAZOLAM HCL 5 MG/5ML IJ SOLN
INTRAMUSCULAR | Status: DC | PRN
  Administered 2020-07-10: 2 mg via INTRAVENOUS

## 2020-07-10 MED ORDER — MISOPROSTOL 200 MCG PO TABS: 200.0000 ug | ORAL_TABLET | Freq: Three times a day (TID) | ORAL | 0 refills | Status: DC

## 2020-07-10 MED ORDER — ACETAMINOPHEN 160 MG/5ML PO SOLN: 325.0000 mg | Freq: Once | ORAL | Status: DC | PRN

## 2020-07-10 MED ORDER — AMISULPRIDE (ANTIEMETIC) 5 MG/2ML IV SOLN: 10.0000 mg | Freq: Once | INTRAVENOUS | Status: DC | PRN

## 2020-07-10 MED ORDER — PROPOFOL 10 MG/ML IV BOLUS
INTRAVENOUS | Status: DC | PRN
  Administered 2020-07-10: 150 mg via INTRAVENOUS

## 2020-07-10 MED ORDER — ACETAMINOPHEN 325 MG PO TABS: 325.0000 mg | ORAL_TABLET | Freq: Once | ORAL | Status: DC | PRN

## 2020-07-10 MED ORDER — LACTATED RINGERS IV SOLN: INTRAVENOUS | Status: DC | PRN

## 2020-07-10 MED ORDER — MIDAZOLAM HCL 2 MG/2ML IJ SOLN
INTRAMUSCULAR | Status: AC
  Filled 2020-07-10: qty 2

## 2020-07-10 MED ORDER — ONDANSETRON HCL 4 MG/2ML IJ SOLN
INTRAMUSCULAR | Status: DC | PRN
  Administered 2020-07-10: 4 mg via INTRAVENOUS

## 2020-07-10 MED ORDER — FENTANYL CITRATE (PF) 250 MCG/5ML IJ SOLN
INTRAMUSCULAR | Status: AC
  Filled 2020-07-10: qty 5

## 2020-07-10 MED ORDER — SCOPOLAMINE 1 MG/3DAYS TD PT72: 1.0000 | MEDICATED_PATCH | TRANSDERMAL | Status: DC

## 2020-07-10 MED ORDER — PROMETHAZINE HCL 25 MG PO TABS: 25.0000 mg | ORAL_TABLET | Freq: Four times a day (QID) | ORAL | 1 refills | Status: DC | PRN

## 2020-07-10 MED ORDER — ACETAMINOPHEN 500 MG PO TABS
1000.0000 mg | ORAL_TABLET | ORAL | Status: AC
  Administered 2020-07-10: 1000 mg via ORAL
  Filled 2020-07-10: qty 2

## 2020-07-10 SURGICAL SUPPLY — 22 items
CATH ROBINSON RED A/P 16FR (CATHETERS) ×2 IMPLANT
DECANTER SPIKE VIAL GLASS SM (MISCELLANEOUS) ×2 IMPLANT
FILTER UTR ASPR ASSEMBLY (MISCELLANEOUS) ×2 IMPLANT
GLOVE BIOGEL PI IND STRL 7.0 (GLOVE) ×1 IMPLANT
GLOVE BIOGEL PI IND STRL 8 (GLOVE) ×1 IMPLANT
GLOVE BIOGEL PI INDICATOR 7.0 (GLOVE) ×1
GLOVE BIOGEL PI INDICATOR 8 (GLOVE) ×1
GLOVE ECLIPSE 8.0 STRL XLNG CF (GLOVE) ×2 IMPLANT
GOWN STRL REUS W/ TWL LRG LVL3 (GOWN DISPOSABLE) ×2 IMPLANT
GOWN STRL REUS W/TWL LRG LVL3 (GOWN DISPOSABLE) ×2
KIT BERKELEY 1ST TRI 3/8 NO TR (MISCELLANEOUS) ×2 IMPLANT
KIT BERKELEY 1ST TRIMESTER 3/8 (MISCELLANEOUS) ×2 IMPLANT
NS IRRIG 1000ML POUR BTL (IV SOLUTION) ×2 IMPLANT
PACK VAGINAL MINOR WOMEN LF (CUSTOM PROCEDURE TRAY) ×2 IMPLANT
PAD OB MATERNITY 4.3X12.25 (PERSONAL CARE ITEMS) ×2 IMPLANT
SET BERKELEY SUCTION TUBING (SUCTIONS) ×2 IMPLANT
TOWEL GREEN STERILE FF (TOWEL DISPOSABLE) ×4 IMPLANT
UNDERPAD 30X36 HEAVY ABSORB (UNDERPADS AND DIAPERS) ×2 IMPLANT
VACURETTE 10 RIGID CVD (CANNULA) ×2 IMPLANT
VACURETTE 7MM CVD STRL WRAP (CANNULA) IMPLANT
VACURETTE 8 RIGID CVD (CANNULA) IMPLANT
VACURETTE 9 RIGID CVD (CANNULA) IMPLANT

## 2020-07-10 NOTE — Op Note (Signed)
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  Quantitative hCG on 10 7 was 105,417, 4 days later it was 108,000 246.  Patient stated she had heavy bleeding yesterday saturating 10 pads.  Hemoglobin in the office was 10.8.  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  As such she was brought in today for a uterine evacuation.  At the time of surgery she had a large amount of tissue.  The uterus did not bleed excessively in fact the blood loss was only 150 cc.    Description of operation: Patient was taken to the operating room where she underwent laryngeal mask airway She was placed in dorsal lithotomy position She was prepped and draped in usual sterile fashion A Graves speculum was placed Her anterior cervix was grasped with a single-tooth tenaculum Her cervix was dilated serially to allow passage of a 9 French curved suction curette Several passes were made and a large amount of tissue was returned In fact I filled up the first suction container and required a second container for the procedure When no more tissue was returned on suction I did a gentle sharp curettage and got good uterine cry in all areas I was gentle in order not to cause any endometrial damage The patient received 0.2 mg of Methergine IM to encourage uterine tone Again there was not excessive bleeding was only 150 cc total The patient was awakened from anesthesia and taken recovery in good stable condition all counts were correct x3 She will be discharged home and followed up in the office week after next for reevaluation of quantitative hCG and to review the  pathology report She will be discharged to home on Zofran and Toradol and oral Cytotec to encourage uterine tone over the next several days  Lazaro Arms, MD 07/10/2020 9:10 PM

## 2020-07-10 NOTE — Progress Notes (Signed)
   Work-in GYN VISIT Patient name: Maria [name] MRN [id number]  Date of birth: 1991-08-12 Chief Complaint:   Pregnancy Ultrasound (feeling weak)  History of Present Illness:   Maria [name] is a 29 y.o. G37P1001 Hispanic female being at 9/1 by LMP of 8/12 seen today as work-in.  Pregnancy confirmation visit 10/7, w/ +bleeding and cramping x few weeks- BHCG 10/7 was 105,417, then 4days later was 108,246. States she bled heavily yesterday, saturated at least 10 pads, and was having to sit on toilet. Not heavy today. Feels weak, tired.  Depression screen [facility] 2/9 03/26/2018 03/08/2017  Decreased Interest 0 0  Down, Depressed, Hopeless 0 0  PHQ - 2 Score 0 0  Altered sleeping 0 -  Tired, decreased energy 0 -  Change in appetite 0 -  Feeling bad or failure about yourself  0 -  Trouble concentrating 0 -  Moving slowly or fidgety/restless 0 -  Suicidal thoughts 0 -  PHQ-9 Score 0 -    Patient's last menstrual period was 05/07/2020. Review of Systems:   Pertinent items are noted in HPI Denies fever/chills, dizziness, headaches, visual disturbances, fatigue, shortness of breath, chest pain, abdominal pain, vomiting, abnormal vaginal discharge/itching/odor/irritation, problems with periods, bowel movements, urination, or intercourse unless otherwise stated above.  Pertinent History Reviewed:  Reviewed past medical,surgical, social, obstetrical and family history.  Reviewed problem list, medications and allergies. Physical Assessment:   Vitals:   07/10/20 1330  BP: 130/79  Pulse: (!) 142  Weight: 125 lb (56.7 kg)  Body mass index is 22.14 kg/m.       Physical Examination:   General appearance: alert, well appearing, and in no distress  Mental status: alert, oriented to person, place, and time  Skin: warm & dry   Cardiovascular: normal heart rate noted  Respiratory: normal respiratory effort, no distress  Abdomen: soft, non-tender   Pelvic: exam declined by the patient  Extremities: no  edema   Chaperone: n/a    Today's pelvic u/s: [place] TA/TV: no IUP visualized, anteverted uterus,complex heterogeneous endometrium with mult cystic spaces,EEC 37.2 mm,no color flow visualized with in the endometrium,normal ovaries,no free fluid,discussed results w/Kim Grace Bushy   Results for orders placed or performed in visit on 07/10/20 (from the past 24 hour(s))  POCT hemoglobin   Collection Time: 07/10/20  1:37 PM  Result Value Ref Range   Hemoglobin 10.8 (A) 11 - 14.6 g/dL    Assessment & Plan:  1) 9wks missed ab/?molar pregnancy> EEC 37.13mm, pt tachycardic, normotensive, feels weak/tired.  Discussed w/ [facility] (on-call at 5pm) and Dr. Alysia Penna (2nd attending now), pt to go to MAU, NPO (last meal @ 1200). Plan D&C @ 1800. Notified MAU providers   Meds: No orders of the defined types were placed in this encounter.   Orders Placed This Encounter  Procedures  . POCT hemoglobin    Return for will schedule f/u after d/c'd.  Cheral Marker CNM, WHNP-BC 07/10/2020 2:59 PM

## 2020-07-10 NOTE — Discharge Instructions (Signed)
Embarazo molar Molar Pregnancy  Un embarazo molar (mola hidatiforme) es una masa de tejido que crece en el tero despus de una concepcin incorrecta. Es Chartered loss adjuster anormal y no llega a ser un feto. En general, el embarazo termina a causa de un aborto espontneo. En algunos casos, posiblemente se requiera tratamiento. Cules son las causas? Esta afeccin se da cuando un vulo es fecundado incorrectamente, de modo que tiene material gentico anormal (cromosomas). Esto puede generar uno de dos tipos de [place] molar:  Embarazo molar completo. Se presenta cuando todos los cromosomas del vulo fecundado son del padre, y ninguno de Chief Strategy Officer.  Embarazo molar parcial. El vulo fecundado tiene cromosomas del padre y de la [place] estos son demasiados. Qu incrementa el riesgo? Es ms probable que esta afeccin se manifieste en:  Mujeres de ms de 35aos o de menos de [address]  Mujeres que tuvieron Chartered loss adjuster molar en el pasado (muy poco frecuente). Entre otros factores de riesgo posibles se incluyen los siguientes:  Fumar ms de 15cigarrillos por Futures trader.  Antecedentes de infertilidad.  Tener tipo de sangre A, B o AB.  Tener falta ( dficit) de vitamina A.  Tomar anticonceptivos por va oral. Cules son los signos o los sntomas? Los sntomas de esta afeccin incluyen los siguientes:  Hemorragia vaginal.  Falta del periodo menstrual.  tero que crece ms rpido que lo previsto en un embarazo normal.  Nuseas y vmitos intensos.  Presin o Financial risk analyst.  Quistes ovricos anormales (quistes tecalutenicos).  Secrecin vaginal similar a uvas.  Presin arterial alta (inicio temprano de preeclampsia).  Hiperactividad tiroidea (hipertiroidismo).  No tener suficientes glbulos rojos o hemoglobina (anemia). Cmo se diagnostica? [place] molar se diagnostica con Sherlyn Lees y con [place]. Cmo se trata? En general, los embarazos molares  finalizan por un aborto espontneo. El mdico puede controlar esta afeccin de la siguiente manera:  Supervisar los niveles de hormonas del embarazo en la sangre, para verificar que disminuyan segn lo previsto.  Administrar un medicamento llamado inmunoglobulina Rho (D). Este medicamento ayuda a prevenir problemas que pueden ocurrir en futuros embarazos como resultado de una protena presente en los glbulos rojos (factor Rh). Pueden administrarle este medicamento si no tiene un factor Rh (usted es Rh negativo) y su pareja tiene ese factor (Rh positivo).  Someterla a quimioterapia. Implica tomar medicamentos que regulan los niveles de las hormonas del [place]. Se recomienda cuando los niveles de estas hormonas no disminuyen segn lo previsto.  Realizar un procedimiento llamado dilatacin y curetaje o curetaje por aspiracin. Se trata de procedimientos menores que implican raspar o Scientist, research (life sciences) molar para extraerlo del tero por la vagina. Incluso si un embarazo molar concluye por s solo, se puede realizar alguno de estos procedimientos para extraer todo el tejido anormal del tero.  Realizar una extraccin quirrgica del tero (histerectoma). Siga estas indicaciones en su casa:  Evite quedar embarazada durante 6 a o segn las indicaciones del mdico. Para evitar un embarazo, no tenga relaciones sexuales o use algn mtodo anticonceptivo confiable cada vez que las tenga.  [employer] medicamentos de venta libre y los recetados solamente como se lo haya indicado el mdico.  Haga reposo segn necesite y retome gradualmente sus actividades habituales.  Considere participar en un grupo de apoyo. Si siente mucha tristeza, pdale ayuda al mdico.  Concurra a todas las visitas de seguimiento como se lo haya indicado el mdico. Esto es importante. Es posible que  deban realizarle anlisis de sangre o ecografas de seguimiento. Comunquese con un mdico si:  Contina teniendo  hemorragias vaginales irregulares.  Siente dolor abdominal. Resumen  Un embarazo molar (mola hidatiforme) es una masa de tejido que crece en el tero despus de una concepcin incorrecta.  Esta afeccin es ms frecuente en mujeres de ms de 35aos o menos de 20 aos, o en las que ya han tenido un Psychiatrist molar en el pasado.  El sntoma ms frecuente es la hemorragia vaginal.  En general, el embarazo molar concluye con un aborto espontneo, y no se requiere TEFL teacher. Esta informacin no tiene Theme park manager el consejo del mdico. Asegrese de hacerle al mdico cualquier pregunta que tenga. Document Revised: 06/12/2017 Document Reviewed: 06/12/2017 Elsevier Patient Education  2020 Elsevier Inc.  Molar Pregnancy  A molar pregnancy (hydatidiform mole) is a mass of tissue that grows in the uterus after an egg is fertilized incorrectly. The mass does not develop into a fetus, and is considered an abnormal pregnancy. Usually, the pregnancy ends on its own through miscarriage. In some cases, treatment may be required. What are the causes? This condition is caused by an egg that is fertilized incorrectly so that it has abnormal genetic material (chromosomes). This can result in one of two types of molar pregnancies:  Complete molar pregnancy. This is when all of the chromosomes in the fertilized egg are from the father, and none are from the mother.  Partial molar pregnancy. This is when the fertilized egg has chromosomes from the father and mother, but it has too many chromosomes. What increases the risk? This condition is more likely to develop in:  Women who are over the age of 21 or under the age of 4.  Women who have had a molar pregnancy in the past (very rare). Other possible risk factors include:  Smoking more than 15 cigarettes a day.  History of infertility.  Having a blood type A, B, or AB.  Having a lack (deficiency) of vitamin A.  Using birth control pills  (oral contraceptives). What are the signs or symptoms? Symptoms of this condition include:  Vaginal bleeding.  Missed menstrual period.  The uterus growing faster than expected for a normal pregnancy.  Severe nausea and vomiting.  Severe pressure or pain in the uterus.  Abnormal ovarian cysts (theca lutein cysts).  Vaginal discharge that looks like grapes.  High blood pressure (early onset of preeclampsia).  Overactive thyroid gland (hyperthyroidism).  Not having enough red blood cells or hemoglobin (anemia). How is this diagnosed? This condition is diagnosed based on ultrasound and blood tests. How is this treated? Usually, molar pregnancies end on their own by miscarriage. A health care provider may manage this condition by:  Monitoring the levels of pregnancy hormones in your blood to make sure that the hormone levels are decreasing as expected.  Giving you a medicine called Rho (D) immune globulin. This medicine helps to prevent problems that may occur in future pregnancies as a result of a protein on red blood cells (Rh factor). You may be given this medicine if you do not have an Rh factor (you are Rh negative) and your sex partner has an Rh factor (he is Rh positive).  Putting you on chemotherapy. This involves taking medicines that regulate levels of pregnancy hormones. This may be done if your pregnancy hormone levels are not decreasing as expected.  Performing a procedure called dilation and curettage (D&C), or vacuum curettage. These are minor procedures that  involve scraping or suctioning the molar pregnancy out of the uterus and removing it through the vagina. Even if a molar pregnancy ends on its own, one of these procedures may be done to make sure that all the abnormal tissue is out of the uterus.  Doing a surgical removal of the uterus (hysterectomy). Follow these instructions at home:  Avoid getting pregnant for 6-12 months, or as long as told by your health  care provider. To avoid getting pregnant, avoid having sex or use a reliable form of birth control every time you have sex.  Take over-the-counter and prescription medicines only as told by your health care provider.  Rest as needed, and slowly return to your normal activities.  Think about joining a support group. If you are struggling with grief, ask your health care provider for help.  Keep all follow-up visits as told by your health care provider. This is important. You may need follow-up blood tests or ultrasounds. Contact a health care provider if:  You continue to have irregular vaginal bleeding.  You have abdominal pain. Summary  A molar pregnancy (hydatidiform mole) is a mass of tissue that grows in the uterus after an egg is fertilized incorrectly.  This condition is more likely to develop in women who are over the age of 70 or under the age of 68 or women who have had a molar pregnancy in the past.  The most common symptom of this condition is vaginal bleeding.  Usually, molar pregnancy ends with a miscarriage, and no treatment is needed. This information is not intended to replace advice given to you by your health care provider. Make sure you discuss any questions you have with your health care provider. Document Revised: 08/25/2017 Document Reviewed: 11/16/2016 Elsevier Patient Education  2020 Elsevier Inc.  Anestesia general en adultos, cuidados posteriores General Anesthesia, Adult, Care After Lea esta informacin sobre cmo cuidarse despus del procedimiento. El mdico tambin podr darle instrucciones ms especficas. Comunquese con su mdico si tiene problemas o preguntas. Qu puedo esperar despus del procedimiento? Luego del procedimiento, son comunes los siguiente efectos secundarios:  Dolor o Associate Professor en el lugar de la va intravenosa (i.v.).  Nuseas.  Vmitos.  Dolor de Advertising copywriter.  Dificultad para concentrarse.  Sentir fro o [employer].  Debilidad o cansancio.  Somnolencia y Management consultant.  Malestar y Tourist information centre manager. Estos efectos secundarios pueden afectar partes del cuerpo que no estuvieron involucradas en la ciruga. Siga estas indicaciones en su casa:  Durante al menos 24horas despus del procedimiento:  Pdale a un adulto responsable que permanezca con usted. Es importante que alguien cuide de usted hasta que se despierte y Statistician.  Descanse todo lo que sea necesario.  No haga lo siguiente: ? Participar en actividades en las que podra caerse o lastimarse. ? Conducir. ? Operar maquinarias pesadas. ? Beber alcohol. ? Tomar somnferos o medicamentos que causen somnolencia. ? Firmar documentos legales ni tomar [employer]. ? Cuidar a nios por su cuenta. Qu debe comer y beber  Siga las indicaciones del mdico respecto de las restricciones de comidas o bebidas.  Cuando [employer], comience a comer cantidades pequeas de alimentos que sean blandos y fciles de Location manager (livianos), como una tostada. Retome su dieta habitual de forma gradual.  Beba suficiente lquido como para mantener la orina de color amarillo plido.  Si vomita, rehidrtese tomando agua, jugo o caldo transparente. Instrucciones generales  Si tiene apnea del sueo, la [place] y ciertos medicamentos pueden aumentar el  riesgo de problemas respiratorios. Siga las indicaciones del mdico respecto al uso de su dispositivo para dormir: ? Siempre que duerma, incluso durante las siestas que tome en el da. ? Mientras tome analgsicos recetados, medicamentos para dormir o medicamentos que producen somnolencia.  Reanude sus actividades normales segn lo indicado por el mdico. Pregntele al mdico qu actividades son seguras para usted.  Tome los medicamentos de venta libre y los recetados solamente como se lo haya indicado el mdico.  Si fuma, no lo haga sin supervisin.  Concurra a todas las visitas de [address] como se  lo haya indicado el mdico. Esto es importante. Comunquese con un mdico si:  Tiene nuseas o vmitos que no mejoran con medicamentos.  No puede comer ni beber sin vomitar.  El dolor no se alivia con medicamentos.  No puede orinar.  Tiene una erupcin cutnea.  Tiene fiebre.  Presenta enrojecimiento alrededor del lugar de la va intravenosa (i.v.) que empeora. Solicite ayuda de inmediato si:  Tiene dificultad para respirar. Siente dolor en el pecho. General Anesthesia, Adult, Care After This sheet gives you information about how to care for yourself after your procedure. Your health care provider may also give you more specific instructions. If you have problems or questions, contact your health care provider. What can I expect after the procedure? After the procedure, the following side effects are common: Pain or discomfort at the IV site. Nausea. Vomiting. Sore throat. Trouble concentrating. Feeling cold or chills. Weak or tired. Sleepiness and fatigue. Soreness and body aches. These side effects can affect parts of the body that were not involved in surgery. Follow these instructions at home:  For at least 24 hours after the procedure: Have a responsible adult stay with you. It is important to have someone help care for you until you are awake and alert. Rest as needed. Do not: Participate in activities in which you could fall or become injured. Drive. Use heavy machinery. Drink alcohol. Take sleeping pills or medicines that cause drowsiness. Make important decisions or sign legal documents. Take care of children on your own. Eating and drinking Follow any instructions from your health care provider about eating or drinking restrictions. When you feel hungry, start by eating small amounts of foods that are soft and easy to digest (bland), such as toast. Gradually return to your regular diet. Drink enough fluid to keep your urine pale yellow. If you vomit,  rehydrate by drinking water, juice, or clear broth. General instructions If you have sleep apnea, surgery and certain medicines can increase your risk for breathing problems. Follow instructions from your health care provider about wearing your sleep device: Anytime you are sleeping, including during daytime naps. While taking prescription pain medicines, sleeping medicines, or medicines that make you drowsy. Return to your normal activities as told by your health care provider. Ask your health care provider what activities are safe for you. Take over-the-counter and prescription medicines only as told by your health care provider. If you smoke, do not smoke without supervision. Keep all follow-up visits as told by your health care provider. This is important. Contact a health care provider if: You have nausea or vomiting that does not get better with medicine. You cannot eat or drink without vomiting. You have pain that does not get better with medicine. You are unable to pass urine. You develop a skin rash. You have a fever. You have redness around your IV site that gets worse. Get help right away if: You have  difficulty breathing. You have chest pain. You have blood in your urine or stool, or you vomit blood. Summary After the procedure, it is common to have a sore throat or nausea. It is also common to feel tired. Have a responsible adult stay with you for the first 24 hours after general anesthesia. It is important to have someone help care for you until you are awake and alert. When you feel hungry, start by eating small amounts of foods that are soft and easy to digest (bland), such as toast. Gradually return to your regular diet. Drink enough fluid to keep your urine pale yellow. Return to your normal activities as told by your health care provider. Ask your health care provider what activities are safe for you. This information is not intended to replace advice given to you by  your health care provider. Make sure you discuss any questions you have with your health care provider. Document Revised: 09/15/2017 Document Reviewed: 04/28/2017 Elsevier Patient Education  2020 [employer].    Observa sangre en la orina o heces, o vomita [place]. Resumen  Despus del procedimiento, es comn tener dolor de garganta y nuseas. Tambin es comn sentirse cansado.  Pdale a un adulto responsable que permanezca con usted durante 24 horas despus de la anestesia general. Es importante que alguien cuide de usted hasta que se despierte y Statistician.  Cuando [employer], comience a comer cantidades pequeas de alimentos que sean blandos y fciles de Location manager (livianos), como una tostada. Retome su dieta habitual de forma gradual.  Beba suficiente lquido como para mantener la orina de color amarillo plido.  Reanude sus actividades normales segn lo indicado por el mdico. Pregntele al mdico qu actividades son seguras para usted. Esta informacin no tiene Theme park manager el consejo del mdico. Asegrese de hacerle al mdico cualquier pregunta que tenga. Document Revised: 07/10/2017 Document Reviewed: 07/10/2017 Elsevier Patient Education  2020 [employer].

## 2020-07-10 NOTE — Anesthesia Procedure Notes (Signed)
Procedure Name: Intubation Date/Time: 07/10/2020 8:36 PM Performed by: Shirlyn Goltz, CRNA Pre-anesthesia Checklist: Patient identified, Emergency Drugs available, Suction available and Patient being monitored Patient Re-evaluated:Patient Re-evaluated prior to induction Oxygen Delivery Method: Circle system utilized Preoxygenation: Pre-oxygenation with 100% oxygen Induction Type: IV induction and Rapid sequence Laryngoscope Size: Mac and 4 Grade View: Grade I Tube type: Oral Tube size: 7.0 mm Number of attempts: 1 Airway Equipment and Method: Stylet Placement Confirmation: ETT inserted through vocal cords under direct vision,  positive ETCO2 and breath sounds checked- equal and bilateral Secured at: 22 cm Tube secured with: Tape Dental Injury: Teeth and Oropharynx as per pre-operative assessment

## 2020-07-10 NOTE — Progress Notes (Signed)

## 2020-07-10 NOTE — Transfer of Care (Signed)
Immediate Anesthesia Transfer of Care Note  Patient: Maria [name]  Procedure(s) Performed: SUCTION DILATATION AND EVACUATION (N/A )  Patient Location: PACU  Anesthesia Type:General  Level of Consciousness: awake, alert , oriented and patient cooperative  Airway & Oxygen Therapy: Patient Spontanous Breathing  Post-op Assessment: Report given to RN and Post -op Vital signs reviewed and stable  Post vital signs: Reviewed and stable  Last Vitals:  Vitals Value Taken Time  BP 128/62 07/10/20 2121  Temp 36.1 C 07/10/20 2120  Pulse 121 07/10/20 2122  Resp 21 07/10/20 2122  SpO2 100 % 07/10/20 2122  Vitals shown include unvalidated device data.  Last Pain:  Vitals:   07/10/20 1552  TempSrc: Oral         Complications: No complications documented.

## 2020-07-10 NOTE — H&P (Addendum)
Preoperative History and Physical  Maria [name] is a 29 y.o. G3P1001 with Patient's last menstrual period was 05/07/2020. admitted for a suction/sharp curettage for removal of non viable pregnancy suspected to be GTD.    PMH:    Past Medical History:  Diagnosis Date  . Amenorrhea 03/25/2013  . Encounter for contraceptive management 03/07/2016  . Medical history non-contributory   . Patient desires pregnancy 04/15/2013  . Pregnant 06/15/2015  . Screening for depression 03/07/2016    PSH:     Past Surgical History:  Procedure Laterality Date  . NO PAST SURGERIES      POb/GynH:      OB History    Gravida  3   Para  1   Term  1   Preterm      AB      Living  1     SAB      TAB      Ectopic      Multiple  0   Live Births  1           SH:   Social History   Tobacco Use  . Smoking status: Never Smoker  . Smokeless tobacco: Never Used  Substance Use Topics  . Alcohol use: Not Currently    Comment: once, twice a year  . Drug use: No    FH:    Family History  Problem Relation Age of Onset  . Cancer Mother 63       breast  . Breast cancer Mother   . Asthma Neg Hx   . Diabetes Neg Hx   . Heart disease Neg Hx   . Hypertension Neg Hx   . Stroke Neg Hx      Allergies: No Known Allergies  Medications:       Current Facility-Administered Medications:  .  0.9 % irrigation (POUR BTL), , , PRN, Lazaro Arms, MD, 1,000 mL at 07/10/20 1928 .  [START ON 07/11/2020] doxycycline (VIBRAMYCIN) 200 mg in dextrose 5 % 250 mL IVPB, 200 mg, Intravenous, On Call to OR, Hermina Staggers, MD .  Melene Muller ON 07/11/2020] ketorolac (TORADOL) 15 MG/ML injection 15 mg, 15 mg, Intravenous, On Call to OR, Hermina Staggers, MD .  lactated ringers infusion, , Intravenous, Continuous, Hermina Staggers, MD .  scopolamine (TRANSDERM-SCOP) 1 MG/3DAYS 1.5 mg, 1 patch, Transdermal, Q72H, Shelton Silvas, MD .  scopolamine (TRANSDERM-SCOP) 1 MG/3DAYS, , , ,   Facility-Administered  Medications Ordered in Other Encounters:  .  lactated ringers infusion, , Intravenous, Continuous PRN, Gwenyth Allegra, CRNA, New Bag at 07/10/20 1911 .  scopolamine (TRANSDERM-SCOP) 1 MG/3DAYS, , Transdermal, Anesthesia Intra-op, Gwenyth Allegra, CRNA, 1 patch at 07/10/20 1930  Review of Systems:   Review of Systems  Constitutional: Negative for fever, chills, weight loss, malaise/fatigue and diaphoresis.  HENT: Negative for hearing loss, ear pain, nosebleeds, congestion, sore throat, neck pain, tinnitus and ear discharge.   Eyes: Negative for blurred vision, double vision, photophobia, pain, discharge and redness.  Respiratory: Negative for cough, hemoptysis, sputum production, shortness of breath, wheezing and stridor.   Cardiovascular: Negative for chest pain, palpitations, orthopnea, claudication, leg swelling and PND.  Gastrointestinal: Positive for abdominal pain. Negative for heartburn, nausea, vomiting, diarrhea, constipation, blood in stool and melena.  Genitourinary: Negative for dysuria, urgency, frequency, hematuria and flank pain.  Musculoskeletal: Negative for myalgias, back pain, joint pain and falls.  Skin: Negative for itching and rash.  Neurological: Negative for dizziness, tingling, tremors,  sensory change, speech change, focal weakness, seizures, loss of consciousness, weakness and headaches.  Endo/Heme/Allergies: Negative for environmental allergies and polydipsia. Does not bruise/bleed easily.  Psychiatric/Behavioral: Negative for depression, suicidal ideas, hallucinations, memory loss and substance abuse. The patient is not nervous/anxious and does not have insomnia.      PHYSICAL EXAM:  Blood pressure 130/70, pulse (!) 131, temperature 98.1 F (36.7 C), temperature source Oral, resp. rate 20, height 5\' 3"  (1.6 m), weight 56.7 kg, last menstrual period 05/07/2020, SpO2 100 %.    Vitals reviewed. Constitutional: She is oriented to person, place, and time. She appears  well-developed and well-nourished.  HENT:  Head: Normocephalic and atraumatic.  Right Ear: External ear normal.  Left Ear: External ear normal.  Nose: Nose normal.  Mouth/Throat: Oropharynx is clear and moist.  Eyes: Conjunctivae and EOM are normal. Pupils are equal, round, and reactive to light. Right eye exhibits no discharge. Left eye exhibits no discharge. No scleral icterus.  Neck: Normal range of motion. Neck supple. No tracheal deviation present. No thyromegaly present.  Cardiovascular: Normal rate, regular rhythm, normal heart sounds and intact distal pulses.  Exam reveals no gallop and no friction rub.   No murmur heard. Respiratory: Effort normal and breath sounds normal. No respiratory distress. She has no wheezes. She has no rales. She exhibits no tenderness.  GI: Soft. Bowel sounds are normal. She exhibits no distension and no mass. There is tenderness. There is no rebound and no guarding.  Genitourinary:       Vulva is normal without lesions Vagina is pink moist without discharge Cervix normal in appearance and pap is normal Uterus is normal size, contour, position, consistency, mobility, non-tender Adnexa is negative with normal sized ovaries by sonogram  Musculoskeletal: Normal range of motion. She exhibits no edema and no tenderness.  Neurological: She is alert and oriented to person, place, and time. She has normal reflexes. She displays normal reflexes. No cranial nerve deficit. She exhibits normal muscle tone. Coordination normal.  Skin: Skin is warm and dry. No rash noted. No erythema. No pallor.  Psychiatric: She has a normal mood and affect. Her behavior is normal. Judgment and thought content normal.    Labs: Results for orders placed or performed during the hospital encounter of 07/10/20 (from the past 336 hour(s))  SARS Coronavirus 2 by RT PCR (hospital order, performed in [facility] hospital lab) Nasopharyngeal Nasopharyngeal Swab   Collection Time: 07/10/20   3:36 PM   Specimen: Nasopharyngeal Swab  Result Value Ref Range   SARS Coronavirus 2 NEGATIVE NEGATIVE  Type and screen   Collection Time: 07/10/20  4:25 PM  Result Value Ref Range   ABO/RH(D) O POS    Antibody Screen NEG    Sample Expiration      07/13/2020,2359 Performed at [facility], [address], [place], [place]   CBC   Collection Time: 07/10/20  4:33 PM  Result Value Ref Range   WBC 11.3 (H) 4.0 - 10.5 K/uL   RBC 3.59 (L) 3.87 - 5.11 MIL/uL   Hemoglobin 10.6 (L) 12.0 - 15.0 g/dL   HCT 07/12/20 (L) 36 - 46 %   MCV 91.9 80.0 - 100.0 fL   MCH 29.5 26.0 - 34.0 pg   MCHC 32.1 30.0 - 36.0 g/dL   RDW 82.5 05.3 - 97.6 %   Platelets 297 150 - 400 K/uL   nRBC 0.0 0.0 - 0.2 %  Results for orders placed or performed in  visit on 07/10/20 (from the past 336 hour(s))  POCT hemoglobin   Collection Time: 07/10/20  1:37 PM  Result Value Ref Range   Hemoglobin 10.8 (A) 11 - 14.6 g/dL  Results for orders placed or performed in visit on 07/06/20 (from the past 336 hour(s))  Beta hCG quant (ref lab)   Collection Time: 07/06/20  8:59 AM  Result Value Ref Range   hCG Quant 108,246 mIU/mL  Results for orders placed or performed in visit on 07/02/20 (from the past 336 hour(s))  Beta hCG quant (ref lab)   Collection Time: 07/02/20 12:08 PM  Result Value Ref Range   hCG Quant 105,417 mIU/mL    EKG: No orders found for this or any previous visit.  Imaging Studies: US OB Comp Less 14 Wks  Result Date: 07/10/2020 DATING AND VIABILITY SONOGRAM Maria EvenerDulce [name] is a 29 y.o. year old G3P1001 with LMP Patient's last menstrual period was 05/07/2020. which would correlate to 9+[redacted]wks gestation.  She has regular menstrual cycles.   She is here today for a confirmatory initial sonogram.Pt has had heavy vaginal bleeding. QHCG 161,096108,246 GESTATION: No IUP visualized   FETAL ACTIVITY:          Heart rate         n/a CERVIX: Appears closed ADNEXA: The ovaries are normal. GESTATIONAL AGE AND   BIOMETRICS: Gestational criteria: Estimated Date of Delivery: 02/11/2021 by LMP now at 9+1 Previous Scans:0 GESTATIONAL SAC No IUP visualized,complex heterogeneous endometrium with mult cystic spaces,no color flow,EEC 37.2 mm  CROWN RUMP LENGTH                                                                  TECHNICIAN COMMENTS: US TA/TV: no IUP visualized, anteverted uterus,complex heterogeneous endometrium with mult cystic spaces,EEC 37.2 mm,no color flow visualized with in the endometrium,normal ovaries,no free fluid,discussed results w/Kim [employer] interpreter ([place]) A copy of this report including all images has been saved and backed up to a second source for retrieval if needed. All measures and details of the anatomical scan, placentation, fluid volume and pelvic anatomy are contained in that report. Amber Flora LippsJ Carl 07/10/2020 1:50 PM Clinical Impression and recommendations: I have reviewed the sonogram results above, combined with the patient's current clinical course, below are my impressions and any appropriate recommendations for management based on the sonographic findings. Complex very thickened  endometrium consistent with possible molar pregnancy, definitely a non viable pregnancy G3P1001 Estimated Date of Delivery: None noted. Normal general sonographic findings Recommend routine care unless otherwise clinically indicated Lazaro ArmsLuther H Linah Klapper 07/10/2020 8:26 PM  US OB Transvaginal  Result Date: 07/10/2020 DATING AND VIABILITY SONOGRAM Maria EvenerDulce [name] is a 29 y.o. year old G3P1001 with LMP Patient's last menstrual period was 05/07/2020. which would correlate to 9+[redacted]wks gestation.  She has regular menstrual cycles.   She is here today for a confirmatory initial sonogram.Pt has had heavy vaginal bleeding. QHCG 045,409108,246 GESTATION: No IUP visualized   FETAL ACTIVITY:          Heart rate         n/a CERVIX: Appears closed ADNEXA: The ovaries are normal. GESTATIONAL AGE AND  BIOMETRICS:  Gestational criteria: Estimated Date of Delivery: 02/11/2021 by LMP now at 9+1 Previous Scans:0  GESTATIONAL SAC No IUP visualized,complex heterogeneous endometrium with mult cystic spaces,no color flow,EEC 37.2 mm  CROWN RUMP LENGTH                                                                  TECHNICIAN COMMENTS: [place] TA/TV: no IUP visualized, anteverted uterus,complex heterogeneous endometrium with mult cystic spaces,EEC 37.2 mm,no color flow visualized with in the endometrium,normal ovaries,no free fluid,discussed results w/[employer] interpreter ([facility]) A copy of this report including all images has been saved and backed up to a second source for retrieval if needed. All measures and details of the anatomical scan, placentation, fluid volume and pelvic anatomy are contained in that report. Amber Flora Lipps 07/10/2020 1:50 PM Clinical Impression and recommendations: I have reviewed the sonogram results above, combined with the patient's current clinical course, below are my impressions and any appropriate recommendations for management based on the sonographic findings. Complex very thickened  endometrium consistent with possible molar pregnancy, definitely a non viable pregnancy G3P1001 Estimated Date of Delivery: None noted. 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

## 2020-07-10 NOTE — Anesthesia Preprocedure Evaluation (Addendum)
Anesthesia Evaluation  Patient identified by MRN, date of birth, ID band Patient awake    Reviewed: Allergy & Precautions, NPO status , Patient's Chart, lab work & pertinent test results  Airway Mallampati: I  TM Distance: >3 FB Neck ROM: Full    Dental  (+) Teeth Intact, Dental Advisory Given   Pulmonary neg pulmonary ROS,    breath sounds clear to auscultation       Cardiovascular negative cardio ROS   Rhythm:Regular Rate:Normal     Neuro/Psych negative neurological ROS  negative psych ROS   GI/Hepatic Neg liver ROS,   Endo/Other    Renal/GU      Musculoskeletal   Abdominal Normal abdominal exam  (+)   Peds  Hematology negative hematology ROS (+)   Anesthesia Other Findings   Reproductive/Obstetrics                           Anesthesia Physical Anesthesia Plan  ASA: II  Anesthesia Plan: General   Post-op Pain Management:    Induction: Intravenous, Rapid sequence and Cricoid pressure planned  PONV Risk Score and Plan: 4 or greater and Ondansetron, Dexamethasone, Midazolam and Scopolamine patch - Pre-op  Airway Management Planned: Oral ETT  Additional Equipment: None  Intra-op Plan:   Post-operative Plan: Extubation in OR  Informed Consent: I have reviewed the patients History and Physical, chart, labs and discussed the procedure including the risks, benefits and alternatives for the proposed anesthesia with the patient or authorized representative who has indicated his/her understanding and acceptance.     Dental advisory given  Plan Discussed with: CRNA  Anesthesia Plan Comments:        Anesthesia Quick Evaluation

## 2020-07-10 NOTE — Progress Notes (Signed)
[place] TA/TV: no IUP visualized, anteverted uterus,complex heterogeneous endometrium with mult cystic spaces,EEC 37.2 mm,no color flow visualized with in the endometrium,normal ovaries,no free fluid

## 2020-07-11 ENCOUNTER — Encounter (HOSPITAL_COMMUNITY): Payer: Self-pay | Admitting: Obstetrics & Gynecology

## 2020-07-11 NOTE — Anesthesia Postprocedure Evaluation (Signed)
Anesthesia Post Note  Patient: Maria [name]  Procedure(s) Performed: SUCTION DILATATION AND EVACUATION (N/A )     Patient location during evaluation: PACU Anesthesia Type: General Level of consciousness: awake and alert Pain management: pain level controlled Vital Signs Assessment: post-procedure vital signs reviewed and stable Respiratory status: spontaneous breathing, nonlabored ventilation, respiratory function stable and patient connected to nasal cannula oxygen Cardiovascular status: blood pressure returned to baseline and stable Postop Assessment: no apparent nausea or vomiting Anesthetic complications: no   No complications documented.  Last Vitals:  Vitals:   07/10/20 2230 07/10/20 2235  BP:  128/68  Pulse: (!) 101 (!) 108  Resp: 17 17  Temp:  (!) 36.2 C  SpO2: 100% 100%    Last Pain:  Vitals:   07/10/20 2235  TempSrc:   PainSc: 3                  Shelton Silvas

## 2020-07-14 LAB — SURGICAL PATHOLOGY

## 2020-07-20 ENCOUNTER — Other Ambulatory Visit: Payer: Self-pay

## 2020-07-20 ENCOUNTER — Encounter: Payer: Self-pay | Admitting: Obstetrics & Gynecology

## 2020-07-20 ENCOUNTER — Ambulatory Visit (INDEPENDENT_AMBULATORY_CARE_PROVIDER_SITE_OTHER): Payer: Self-pay | Admitting: Obstetrics & Gynecology

## 2020-07-20 VITALS — BP 146/78 | HR 128 | Ht 63.0 in | Wt 123.5 lb

## 2020-07-20 DIAGNOSIS — O021 Missed abortion: Secondary | ICD-10-CM

## 2020-07-20 MED ORDER — DESOGESTREL-ETHINYL ESTRADIOL 0.15-30 MG-MCG PO TABS: 1.0000 | ORAL_TABLET | Freq: Every day | ORAL | 11 refills | Status: DC

## 2020-07-20 NOTE — Progress Notes (Signed)
  HPI: Patient returns for routine postoperative follow-up having undergone D&C  on 07/10/20.  The patient's immediate postoperative recovery has been unremarkable. Since hospital discharge the patient reports no bleeding.   Current Outpatient Medications: ibuprofen (ADVIL) 200 MG tablet, Take 400 mg by mouth every 6 (six) hours as needed., Disp: , Rfl:   No current facility-administered medications for this visit.    Blood pressure (!) 146/78, pulse (!) 128, height 5\' 3"  (1.6 m), weight 123 lb 8 oz (56 kg), last menstrual period 05/07/2020, unknown if currently breastfeeding.  Physical Exam: Normal post D&C  Diagnostic Tests:   Pathology: Complete molar pregnancy  Impression: S/p D&C for complete molar pregnancy  Plan: Quantitative HCG today and repeat based on level today  Follow up: As directed    Orders Placed This Encounter  Procedures  . Beta hCG quant (ref lab)   Meds ordered this encounter  Medications  . desogestrel-ethinyl estradiol (APRI) 0.15-30 MG-MCG tablet    Sig: Take 1 tablet by mouth daily.    Dispense:  28 tablet    Refill:  11     02-02-2004, MD

## 2020-07-21 ENCOUNTER — Telehealth: Payer: Self-pay | Admitting: Obstetrics & Gynecology

## 2020-07-21 LAB — BETA HCG QUANT (REF LAB): hCG Quant: 4431 m[IU]/mL

## 2020-07-21 NOTE — Progress Notes (Signed)
Dramatic drop in HCG post evacuation  Recommend repeat in 2 weeks and will plan on repeat based on each value, when 0, will go to once monthly for 1 year  Pt is on OCP for [facility]

## 2020-07-21 NOTE — Telephone Encounter (Signed)
I contacted patient per provider's request to see if everything is going well after her surgery and to see if she needed anything called in or information from our office. Patient stated that she is doing well and is in less pain, I also informed patient that her HCG levels has dropped and everything is looking good at our end. I let patient know know that she is needing to repeat lab work in two weeks to see if her levels has drop some more. Patient verbally agreed and understood what I was conveying to her in [place]. I informed patient is there is anything she needs to please not hesitate and called the office and ask to speak with me Toney Reil, since I am the only one that speaks spanish.

## 2020-08-04 ENCOUNTER — Other Ambulatory Visit: Payer: Self-pay

## 2020-08-04 DIAGNOSIS — O021 Missed abortion: Secondary | ICD-10-CM

## 2020-08-05 LAB — BETA HCG QUANT (REF LAB): hCG Quant: 4282 m[IU]/mL

## 2020-08-06 ENCOUNTER — Telehealth: Payer: Self-pay | Admitting: *Deleted

## 2020-08-06 NOTE — Telephone Encounter (Signed)
Patient informed per Dr Despina Hidden, she needs repeat HCG in 2 weeks.  Pt verbalized understanding and appt made.

## 2020-08-18 ENCOUNTER — Other Ambulatory Visit: Payer: Self-pay

## 2020-08-18 DIAGNOSIS — O021 Missed abortion: Secondary | ICD-10-CM

## 2020-08-19 LAB — HEMOGLOBIN A1C
Est. average glucose Bld gHb Est-mCnc: 91 mg/dL
Hgb A1c MFr Bld: 4.8 % (ref 4.8–5.6)

## 2020-09-09 DIAGNOSIS — Z23 Encounter for immunization: Secondary | ICD-10-CM | POA: Diagnosis not present

## 2020-09-14 ENCOUNTER — Other Ambulatory Visit: Payer: Self-pay

## 2020-09-14 DIAGNOSIS — O021 Missed abortion: Secondary | ICD-10-CM | POA: Diagnosis not present

## 2020-09-15 ENCOUNTER — Telehealth: Payer: Self-pay | Admitting: *Deleted

## 2020-09-15 LAB — BETA HCG QUANT (REF LAB): hCG Quant: 1865 m[IU]/mL

## 2020-09-15 NOTE — Telephone Encounter (Signed)
Patient informed to return in 2 weeks for HCG per Dr Despina Hidden.

## 2020-09-30 ENCOUNTER — Other Ambulatory Visit: Payer: Self-pay

## 2020-09-30 DIAGNOSIS — O021 Missed abortion: Secondary | ICD-10-CM

## 2020-10-01 ENCOUNTER — Other Ambulatory Visit: Payer: Self-pay | Admitting: Adult Health

## 2020-10-01 ENCOUNTER — Telehealth: Payer: Self-pay | Admitting: Adult Health

## 2020-10-01 DIAGNOSIS — Z8759 Personal history of other complications of pregnancy, childbirth and the puerperium: Secondary | ICD-10-CM

## 2020-10-01 LAB — BETA HCG QUANT (REF LAB): hCG Quant: 3703 m[IU]/mL

## 2020-10-01 NOTE — Telephone Encounter (Signed)
Spoke With Patient regarding her results of her Blood work and informed her that she needs to come back for more blood work on Wednesday. Patient verbalizated understanding.

## 2020-10-01 NOTE — Progress Notes (Signed)
Ck QHCG in 1 week order is in.

## 2020-10-07 ENCOUNTER — Other Ambulatory Visit: Payer: Self-pay

## 2020-10-08 ENCOUNTER — Other Ambulatory Visit: Payer: Self-pay | Admitting: Obstetrics & Gynecology

## 2020-10-08 ENCOUNTER — Other Ambulatory Visit: Payer: Self-pay

## 2020-10-08 DIAGNOSIS — Z8759 Personal history of other complications of pregnancy, childbirth and the puerperium: Secondary | ICD-10-CM

## 2020-10-08 DIAGNOSIS — O09A0 Supervision of pregnancy with history of molar pregnancy, unspecified trimester: Secondary | ICD-10-CM

## 2020-10-09 ENCOUNTER — Other Ambulatory Visit: Payer: Self-pay | Admitting: Obstetrics & Gynecology

## 2020-10-09 ENCOUNTER — Ambulatory Visit (INDEPENDENT_AMBULATORY_CARE_PROVIDER_SITE_OTHER): Payer: BC Managed Care – PPO

## 2020-10-09 ENCOUNTER — Ambulatory Visit (INDEPENDENT_AMBULATORY_CARE_PROVIDER_SITE_OTHER): Payer: BC Managed Care – PPO | Admitting: Obstetrics & Gynecology

## 2020-10-09 VITALS — BP 144/82 | HR 127 | Ht 61.0 in | Wt 128.0 lb

## 2020-10-09 DIAGNOSIS — O09A0 Supervision of pregnancy with history of molar pregnancy, unspecified trimester: Secondary | ICD-10-CM

## 2020-10-09 DIAGNOSIS — O019 Hydatidiform mole, unspecified: Secondary | ICD-10-CM

## 2020-10-09 LAB — BETA HCG QUANT (REF LAB): hCG Quant: 4433 m[IU]/mL

## 2020-10-09 NOTE — Progress Notes (Signed)
PELVIC US TA/TV: homogeneous retroflexed uterus ,  1.1 x .7 x 1 cm complex cystic hypervascular area in the right fundal myometrium vs endometrium,EEC 4.8 mm,normal right ovary,hemorrhagic left ovarian cyst 2.1 x 2 x 1.2 cm,no free fluid,Dr Eure reviewed images during ultrasound

## 2020-10-09 NOTE — Progress Notes (Signed)
Follow up appointment for results  Chief Complaint  Patient presents with  . Follow-up    Blood pressure (!) 144/82, pulse (!) 127, height 5\' 1"  (1.549 m), weight 128 lb (58.1 kg), last menstrual period 09/08/2020, unknown if currently breastfeeding.  09/10/2020 PELVIC COMPLETE WITH TRANSVAGINAL  Result Date: 10/09/2020  [facility] [address] [place]                                                                                                                                   GYNECOLOGIC SONOGRAM Maria [name] is a 30 y.o. G3P1001 No LMP recorded. for a pelvic sonogram for HX of molar pregnancy. Uterus                      6.3 x 5.6 x 4.1 cm, Total uterine volume 71 cc, homogeneous retroflexed uterus , 1.1 x .7 x 1 cm complex cystic hypervascular area in the right fundal myometrium vs endometrium Endometrium          4.8 mm, symmetrical, wnl Right ovary             2.2 x 1.9 x 2.5 cm, wnl Left ovary                3.4 x 1.7 x 3.2 cm, hemorrhagic left ovarian cyst 2.1 x 2 x 1.2 cm No free fluid Technician Comments: PELVIC 26 TA/TV: homogeneous retroflexed uterus , 1.1 x .7 x 1 cm complex cystic hypervascular area in the right fundal myometrium vs endometrium,EEC 4.8 mm,normal right ovary,hemorrhagic left ovarian cyst 2.1 x 2 x 1.2 cm,no free fluid,Dr Ormand Senn reviewed images during ultrasound Chaperone [place] [employer] 10/09/2020 10:41 AM Clinical Impression and recommendations: I have reviewed the sonogram results above, combined with the patient's current clinical course, below are my impressions and any appropriate recommendations for management based on the sonographic findings. 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   Orders for this encounter: No orders of the defined types were placed in this  encounter.   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       Face to face time:  20 minutes  Greater than 50% of the visit time was spent in counseling and coordination of care with the patient.  The summary and outline of the counseling and care coordination is summarized in the note above.   All questions were answered.  Past Medical History:  Diagnosis Date  . Amenorrhea 03/25/2013  . Encounter for contraceptive management 03/07/2016  . Medical history non-contributory   . Patient desires pregnancy 04/15/2013  . Pregnant 06/15/2015  . Screening for depression 03/07/2016    Past Surgical History:  Procedure Laterality Date  . DILATION AND EVACUATION N/A 07/10/2020   Procedure: SUCTION DILATATION AND EVACUATION;  Surgeon: Lazaro Arms, MD;  Location: [facility] OR;  Service: Gynecology;  Laterality: N/A;  . NO PAST SURGERIES      OB History    Gravida  3   Para  1   Term  1   Preterm      AB       Living  1     SAB      IAB      Ectopic      Multiple  0   Live Births  1           No Known Allergies  Social History   Socioeconomic History  . Marital status: Married    Spouse name: Not on file  . Number of children: Not on file  . Years of education: Not on file  . Highest education level: Not on file  Occupational History  . Not on file  Tobacco Use  . Smoking status: Never Smoker  . Smokeless tobacco: Never Used  Vaping Use  . Vaping Use: Never used  Substance and Sexual Activity  . Alcohol use: Not Currently    Comment: once, twice a year  . Drug use: No  . Sexual activity: Not Currently  Other Topics Concern  . Not on file  Social History Narrative  . Not on file   Social Determinants of Health   Financial Resource Strain: Not on file  Food Insecurity: Not on file  Transportation Needs: Not on file  Physical Activity: Not on file  Stress: Not on file  Social Connections: Not on file    Family History  Problem Relation Age of Onset  . Cancer Mother 35       breast  . Breast cancer Mother   . Asthma Neg Hx   . Diabetes Neg Hx   . Heart disease Neg Hx   . Hypertension Neg Hx   . Stroke Neg Hx

## 2020-10-12 ENCOUNTER — Ambulatory Visit (HOSPITAL_COMMUNITY)
Admission: RE | Admit: 2020-10-12 | Discharge: 2020-10-12 | Disposition: A | Discharge status: Home / Self Care | Payer: BC Managed Care – PPO | Source: Ambulatory Visit | Attending: Obstetrics & Gynecology | Admitting: Obstetrics & Gynecology

## 2020-10-12 ENCOUNTER — Other Ambulatory Visit: Payer: Self-pay

## 2020-10-12 DIAGNOSIS — R918 Other nonspecific abnormal finding of lung field: Secondary | ICD-10-CM | POA: Diagnosis not present

## 2020-10-12 DIAGNOSIS — N858 Other specified noninflammatory disorders of uterus: Secondary | ICD-10-CM | POA: Diagnosis not present

## 2020-10-12 DIAGNOSIS — O019 Hydatidiform mole, unspecified: Secondary | ICD-10-CM | POA: Insufficient documentation

## 2020-10-12 MED ORDER — GADOBUTROL 1 MMOL/ML IV SOLN
7.0000 mL | Freq: Once | INTRAVENOUS | Status: AC | PRN
  Administered 2020-10-12: 7 mL via INTRAVENOUS

## 2020-10-12 MED ORDER — IOHEXOL 300 MG/ML  SOLN
100.0000 mL | Freq: Once | INTRAMUSCULAR | Status: AC | PRN
  Administered 2020-10-12: 75 mL via INTRAVENOUS

## 2020-10-15 ENCOUNTER — Encounter: Payer: Self-pay | Admitting: Gynecologic Oncology

## 2020-10-16 ENCOUNTER — Other Ambulatory Visit: Payer: Self-pay | Admitting: Hematology and Oncology

## 2020-10-16 ENCOUNTER — Inpatient Hospital Stay: Payer: BC Managed Care – PPO | Attending: Gynecologic Oncology | Admitting: Gynecologic Oncology

## 2020-10-16 ENCOUNTER — Ambulatory Visit (HOSPITAL_COMMUNITY)
Admission: RE | Admit: 2020-10-16 | Discharge: 2020-10-16 | Disposition: A | Discharge status: Home / Self Care | Payer: BC Managed Care – PPO | Source: Ambulatory Visit | Attending: Gynecologic Oncology | Admitting: Gynecologic Oncology

## 2020-10-16 ENCOUNTER — Ambulatory Visit: Payer: BC Managed Care – PPO | Admitting: Hematology and Oncology

## 2020-10-16 ENCOUNTER — Inpatient Hospital Stay: Payer: BC Managed Care – PPO

## 2020-10-16 ENCOUNTER — Other Ambulatory Visit: Payer: Self-pay

## 2020-10-16 ENCOUNTER — Inpatient Hospital Stay (HOSPITAL_BASED_OUTPATIENT_CLINIC_OR_DEPARTMENT_OTHER): Payer: BC Managed Care – PPO | Admitting: Hematology and Oncology

## 2020-10-16 ENCOUNTER — Encounter: Payer: Self-pay | Admitting: Hematology and Oncology

## 2020-10-16 ENCOUNTER — Encounter: Payer: Self-pay | Admitting: Gynecologic Oncology

## 2020-10-16 VITALS — BP 152/85 | HR 128 | Temp 96.8°F | Resp 18 | Ht 61.0 in | Wt 124.0 lb

## 2020-10-16 VITALS — BP 152/85 | HR 128 | Temp 96.8°F | Resp 18 | Wt 124.0 lb

## 2020-10-16 DIAGNOSIS — O019 Hydatidiform mole, unspecified: Secondary | ICD-10-CM | POA: Insufficient documentation

## 2020-10-16 DIAGNOSIS — R918 Other nonspecific abnormal finding of lung field: Secondary | ICD-10-CM | POA: Diagnosis not present

## 2020-10-16 DIAGNOSIS — R9389 Abnormal findings on diagnostic imaging of other specified body structures: Secondary | ICD-10-CM | POA: Diagnosis not present

## 2020-10-16 DIAGNOSIS — C58 Malignant neoplasm of placenta: Secondary | ICD-10-CM | POA: Diagnosis not present

## 2020-10-16 HISTORY — DX: Hydatidiform mole, unspecified: O01.9

## 2020-10-16 LAB — CMP (CANCER CENTER ONLY)
ALT: 13 U/L (ref 0–44)
AST: 16 U/L (ref 15–41)
Albumin: 4.4 g/dL (ref 3.5–5.0)
Alkaline Phosphatase: 80 U/L (ref 38–126)
Anion gap: 12 (ref 5–15)
BUN: 8 mg/dL (ref 6–20)
CO2: 21 mmol/L — ABNORMAL LOW (ref 22–32)
Calcium: 9.6 mg/dL (ref 8.9–10.3)
Chloride: 104 mmol/L (ref 98–111)
Creatinine: 0.73 mg/dL (ref 0.44–1.00)
GFR, Estimated: >60 mL/min (ref >60)
Glucose, Bld: 104 mg/dL — ABNORMAL HIGH (ref 70–99)
Potassium: 3.3 mmol/L — ABNORMAL LOW (ref 3.5–5.1)
Sodium: 137 mmol/L (ref 135–145)
Total Bilirubin: 0.4 mg/dL (ref 0.3–1.2)
Total Protein: 8.2 g/dL — ABNORMAL HIGH (ref 6.5–8.1)

## 2020-10-16 LAB — CBC WITH DIFFERENTIAL (CANCER CENTER ONLY)
Abs Immature Granulocytes: 0.03 10*3/uL (ref 0.00–0.07)
Basophils Absolute: 0.1 10*3/uL (ref 0.0–0.1)
Basophils Relative: 1 %
Eosinophils Absolute: 0 10*3/uL (ref 0.0–0.5)
Eosinophils Relative: 0 %
HCT: 43.1 % (ref 36.0–46.0)
Hemoglobin: 14.2 g/dL (ref 12.0–15.0)
Immature Granulocytes: 0 %
Lymphocytes Relative: 16 %
Lymphs Abs: 1.6 10*3/uL (ref 0.7–4.0)
MCH: 28.9 pg (ref 26.0–34.0)
MCHC: 32.9 g/dL (ref 30.0–36.0)
MCV: 87.8 fL (ref 80.0–100.0)
Monocytes Absolute: 0.4 10*3/uL (ref 0.1–1.0)
Monocytes Relative: 4 %
Neutro Abs: 8.1 10*3/uL — ABNORMAL HIGH (ref 1.7–7.7)
Neutrophils Relative %: 79 %
Platelet Count: 342 10*3/uL (ref 150–400)
RBC: 4.91 MIL/uL (ref 3.87–5.11)
RDW: 12.8 % (ref 11.5–15.5)
WBC Count: 10.3 10*3/uL (ref 4.0–10.5)
nRBC: 0 % (ref 0.0–0.2)

## 2020-10-16 LAB — TSH: TSH: 1.095 u[IU]/mL (ref 0.308–3.960)

## 2020-10-16 MED ORDER — GADOBUTROL 1 MMOL/ML IV SOLN
6.0000 mL | Freq: Once | INTRAVENOUS | Status: AC | PRN
  Administered 2020-10-16: 6 mL via INTRAVENOUS

## 2020-10-16 NOTE — Patient Instructions (Signed)
It was a pleasure meeting you. I will let you know the results of your lab tests and your brain MRI when I have them.  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 This is cancer that develops from abnormal pregnancy tissue. We will use the pregnancy hormone (hcg) as a tumor marker, to follow how you are responding to treatment.  We will plan to update your pap test after you finish treatment.

## 2020-10-16 NOTE — Progress Notes (Signed)
[facility] CONSULT NOTE  Patient Care Team: Patient, No Pcp Per as PCP - General (General Practice)  ASSESSMENT & PLAN:  Gestational trophoblastic neoplasm I have reviewed imaging studies with the patient and her husband I have also discussed the case extensively with GYN surgeon I agree that she is considered low risk; MRI of the brain is pending Assuming the MRI result is okay, we will proceed with chemotherapy within a week from now I reviewed several protocols with the patient and family I felt that low-dose daily methotrexate is 0.4mg /kg for 5 days, cycle of every 14 days in general is extremely well-tolerated We did discuss potential risk of pancytopenia, mucositis, infection, minor hair loss, nausea and others and she is in agreement to proceed I will schedule chemo education class in the near future prior to treatment I plan to prescribe folic acid supplementation once we are ready to start treatment I plan to see her before first dose of therapy Her husband typically works night shift and I will try to accommodate his need and schedule appointment either soon after his shift ends at 7:30 AM or latest appointment of the day before he has to return back to work at 11 PM The patient is currently not driving   Orders Placed This Encounter  Procedures  . CBC with Differential (Cancer Center Only)    Standing Status:   Standing    Number of Occurrences:   20    Standing Expiration Date:   10/16/2021  . CMP (Cancer Center only)    Standing Status:   Standing    Number of Occurrences:   20    Standing Expiration Date:   10/16/2021  . Beta HCG, Quant (tumor marker)    Standing Status:   Standing    Number of Occurrences:   11    Standing Expiration Date:   10/16/2021    The total time spent in the appointment was 60 minutes encounter with patients including review of chart and various tests results, discussions about plan of care and coordination of care plan   All  questions were answered. The patient knows to call the clinic with any problems, questions or concerns. No barriers to learning was detected.  Artis DelayNi Nicolet Griffy, MD 1/21/20223:53 PM  CHIEF COMPLAINTS/PURPOSE OF CONSULTATION:  Gestational trophoblastic disease, for further management  HISTORY OF PRESENTING ILLNESS:  Maria EvenerDulce [name] 30 y.o. female is here because of gestational trophoblastic disease She understand a little bit of [place] interpreter is present throughout the visit Her husband is present throughout the visit as well  I have reviewed her chart and materials related to her cancer extensively and collaborated history with the patient. Summary of oncologic history is as follows: The patient probably has missed miscarriage late last year She has 1 son born in 2017 The patient is otherwise healthy She is currently not working Her husband, Marquita PalmsMario, works night shift between 11 PM to 7:30 AM, with sporadic schedule, in general, 6 days block each time  On July 10, 2020, she underwent ultrasound of the pelvis which showed  Complex very thickened  endometrium consistent with possible molar pregnancy, definitely a non viable pregnancy G3P1001 Estimated Date of Delivery: None noted.  Normal general sonographic findings  Her gynecologist performed suction and sharp uterine evacuation, pathology showed FINAL MICROSCOPIC DIAGNOSIS:   A. PRODUCTS OF CONCEPTION, DILATATION AND EVACUATION:  - Chorionic villi with features consistent with molar gestation.  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 Solid 5 mm nodule in the left lower lobe, with additional scattered 94mm or smaller pulmonary nodules. Which in the setting of known malignancy are suspicious for metastatic involvement.  2. Please refer to same day MRI of the pelvis for findings regarding uterine gestational trophoblastic disease.  3.  No evidence of metastatic disease in the abdomen or pelvis.  MRI of the pelvis on October 12, 2020 showed  2.4 cm ill-defined hyperenhancing lesion in the right posterior fundal myometrium, suspicious for invasive mole.  No evidence of extra-uterine metastatic disease.  The patient is not symptomatic She denies pelvic pain or excessive vaginal bleeding Her appetite is normal She desire further pregnancy  MEDICAL HISTORY:  Past Medical History:  Diagnosis Date  . Amenorrhea 03/25/2013  . Encounter for contraceptive management 03/07/2016  . Gestational trophoblastic neoplasm   . History of molar pregnancy   . Medical history non-contributory   . Patient desires pregnancy 04/15/2013  . Pregnant 06/15/2015  . Screening for depression 03/07/2016    SURGICAL HISTORY: Past Surgical History:  Procedure Laterality Date  . DILATION AND EVACUATION N/A 07/10/2020   Procedure: SUCTION DILATATION AND EVACUATION;  Surgeon: Lazaro Arms, MD;  Location: [facility] OR;  Service: Gynecology;  Laterality: N/A;    SOCIAL HISTORY: Social History   Socioeconomic History  . Marital status: Married    Spouse name: Not on file  . Number of children: Not on file  . Years of education: Not on file  . Highest education level: Not on file  Occupational History  . Occupation: home maker  Tobacco Use  . Smoking status: Never Smoker  . Smokeless tobacco: Never  Used  Vaping Use  . Vaping Use: Never used  Substance and Sexual Activity  . Alcohol use: Not Currently    Comment: once, twice a year  . Drug use: No  . Sexual activity: Not Currently  Other Topics Concern  . Not on file  Social History Narrative  . Not on file   Social Determinants of Health   Financial Resource Strain: Not on file  Food Insecurity: Not on file  Transportation Needs: Not on file  Physical Activity: Not on file  Stress: Not on file  Social Connections: Not on file  Intimate Partner Violence: Not on file    FAMILY HISTORY: Family History  Problem Relation Age of Onset  . Cancer Mother 72       breast  . Asthma Neg Hx   . Diabetes Neg Hx   . Heart disease Neg Hx   . Hypertension Neg Hx   . Stroke Neg Hx   . Colon cancer Neg Hx   . Ovarian cancer Neg Hx   . Endometrial cancer Neg Hx   . Pancreatic cancer Neg Hx   . Prostate cancer Neg Hx     ALLERGIES:  has No Known Allergies.  MEDICATIONS:  Current Outpatient Medications  Medication Sig Dispense Refill  .  desogestrel-ethinyl estradiol (APRI) 0.15-30 MG-MCG tablet Take 1 tablet by mouth daily. 28 tablet 11  . Ferrous Sulfate (IRON PO) Take 1 tablet by mouth daily.    . Multiple Vitamin (MULTIVITAMIN PO) Take 1 tablet by mouth daily.     No current facility-administered medications for this visit.    REVIEW OF SYSTEMS:   Constitutional: Denies fevers, chills or abnormal night sweats Eyes: Denies blurriness of vision, double vision or watery eyes Ears, nose, mouth, throat, and face: Denies mucositis or sore throat Respiratory: Denies cough, dyspnea or wheezes Cardiovascular: Denies palpitation, chest discomfort or lower extremity swelling Gastrointestinal:  Denies nausea, heartburn or change in bowel habits Skin: Denies abnormal skin rashes Lymphatics: Denies new lymphadenopathy or easy bruising Neurological:Denies numbness, tingling or new weaknesses Behavioral/Psych: Mood is stable, no new  changes  All other systems were reviewed with the patient and are negative.  PHYSICAL EXAMINATION: ECOG PERFORMANCE STATUS: 0 - Asymptomatic  Vitals:   10/16/20 1302  BP: (!) 152/85  Pulse: (!) 128  Resp: 18  Temp: (!) 96.8 F (36 C)  SpO2: 100%   Filed Weights   10/16/20 1302  Weight: 124 lb (56.2 kg)    GENERAL:alert, no distress and comfortable SKIN: skin color, texture, turgor are normal, no rashes or significant lesions EYES: normal, conjunctiva are pink and non-injected, sclera clear OROPHARYNX:no exudate, no erythema and lips, buccal mucosa, and tongue normal  NECK: supple, thyroid normal size, non-tender, without nodularity LYMPH:  no palpable lymphadenopathy in the cervical, axillary or inguinal LUNGS: clear to auscultation and percussion with normal breathing effort HEART: mild tachycardia, regular rate & rhythm and no murmurs and no lower extremity edema ABDOMEN:abdomen soft, non-tender and normal bowel sounds Musculoskeletal:no cyanosis of digits and no clubbing  PSYCH: alert & oriented x 3 with fluent speech NEURO: no focal motor/sensory deficits  LABORATORY DATA:  I have reviewed the data as listed Lab Results  Component Value Date   WBC 10.3 10/16/2020   HGB 14.2 10/16/2020   HCT 43.1 10/16/2020   MCV 87.8 10/16/2020   PLT 342 10/16/2020   Recent Labs    10/16/20 1126  NA 137  K 3.3*  CL 104  CO2 21*  GLUCOSE 104*  BUN 8  CREATININE 0.73  CALCIUM 9.6  GFRNONAA >60  PROT 8.2*  ALBUMIN 4.4  AST 16  ALT 13  ALKPHOS 80  BILITOT 0.4    RADIOGRAPHIC STUDIES: I have personally reviewed the radiological images as listed and agreed with the findings in the report. MR PELVIS W WO CONTRAST  Result Date: 10/12/2020 CLINICAL DATA:  Gestational trophoblastic neoplasia.  Staging. EXAM: MRI PELVIS WITHOUT AND WITH CONTRAST TECHNIQUE: Multiplanar multisequence MR imaging of the pelvis was performed both before and after administration of intravenous  contrast. CONTRAST:  82mL GADAVIST GADOBUTROL 1 MMOL/ML IV SOLN COMPARISON:  None. FINDINGS: Lower Urinary Tract: No urinary bladder or urethral abnormality identified. Bowel: Unremarkable pelvic bowel loops. Vascular/Lymphatic: Unremarkable. No pathologically enlarged pelvic lymph nodes identified. Reproductive: -- Uterus: Measures 9.3 x 3.9 x 6.4 cm (volume = 120 cm^3). A poorly defined area is seen in the right posterior fundal myometrium, which shows prominent internal vascularity and hyperenhancement. This measures approximately 2.4 x 2.4 x 2.3 cm, and is suspicious for invasive mole. There is no evidence of extension through the uterine serosa. No mass or fluid is seen within the endometrial cavity. Cervix and vagina are unremarkable. -- Right ovary: Appears normal. No ovarian or adnexal masses identified. -- Left  ovary: Appears normal. No ovarian or adnexal masses identified. Other: No peritoneal thickening or abnormal free fluid. Musculoskeletal:  Unremarkable. IMPRESSION: 2.4 cm ill-defined hyperenhancing lesion in the right posterior fundal myometrium, suspicious for invasive mole. No evidence of extra-uterine metastatic disease. Electronically Signed   By: Danae Orleans M.D.   On: 10/12/2020 11:50   CT CHEST ABDOMEN PELVIS W CONTRAST  Result Date: 10/12/2020 CLINICAL DATA:  History of gestational trophoblastic disease, staging for extra uterine disease and persistent disease in the myometrium. History of molar pregnancy in the myometrium ultrasound on 10/09/2020 showing persistence/recurrence gestational trophoblastic disease. EXAM: CT CHEST, ABDOMEN, AND PELVIS WITH CONTRAST TECHNIQUE: Multidetector CT imaging of the chest, abdomen and pelvis was performed following the standard protocol during bolus administration of intravenous contrast. CONTRAST:  66mL OMNIPAQUE IOHEXOL 300 MG/ML  SOLN COMPARISON:  Pelvic ultrasound July 10, 2020 and October 09, 2020, MRI pelvis October 12, 2020. FINDINGS: CT  CHEST FINDINGS Cardiovascular: No significant vascular findings. Normal heart size. No pericardial effusion. Mediastinum/Nodes: No enlarged mediastinal, hilar, or axillary lymph nodes. Thyroid gland, trachea, and esophagus demonstrate no significant findings. Lungs/Pleura: Solid 5 mm nodule in the left lower lobe (series 4, image 98). There are additional scattered tiny pulmonary nodules which measure 2 mm or smaller for instance in the left upper lobe on series 4 image 35, image 42, and 62. No pleural effusion or pneumothorax. Musculoskeletal: No chest wall mass or suspicious bone lesions identified. CT ABDOMEN PELVIS FINDINGS Hepatobiliary: No focal liver abnormality is seen. No gallstones, gallbladder wall thickening, or biliary dilatation. Pancreas: Unremarkable. No pancreatic ductal dilatation or surrounding inflammatory changes. Spleen: Normal in size without focal abnormality. Adrenals/Urinary Tract: Adrenal glands are unremarkable. Kidneys are normal, without renal calculi, focal lesion, or hydronephrosis. Bladder is unremarkable. Stomach/Bowel: Stomach is within normal limits. Appendix appears normal. No evidence of bowel wall thickening, distention, or inflammatory changes. Vascular/Lymphatic: No significant vascular findings are present. No enlarged abdominal or pelvic lymph nodes. Reproductive: Please refer to same day MRI of the pelvis findings regarding the uterus/gestational trophoblastic disease. Other: No abdominal wall hernia or abnormality. No abdominopelvic ascites. Musculoskeletal: No suspicious lytic or blastic lesions of bone. No acute osseous abnormality IMPRESSION: 1. Solid 5 mm nodule in the left lower lobe, with additional scattered 13mm or smaller pulmonary nodules. Which in the setting of known malignancy are suspicious for metastatic involvement. 2. Please refer to same day MRI of the pelvis for findings regarding uterine gestational trophoblastic disease. 3.  No evidence of metastatic  disease in the abdomen or pelvis. Electronically Signed   By: Maudry Mayhew MD   On: 10/12/2020 12:23   US PELVIC COMPLETE WITH TRANSVAGINAL  Result Date: 10/09/2020  Center for [facility] [address] [place] [address]  GYNECOLOGIC SONOGRAM Maria EvenerDulce [name] is a 30 y.o. G3P1001 No LMP recorded. for a pelvic sonogram for HX of molar pregnancy. Uterus                      6.3 x 5.6 x 4.1 cm, Total uterine volume 71 cc, homogeneous retroflexed uterus , 1.1 x .7 x 1 cm complex cystic hypervascular area in the right fundal myometrium vs endometrium Endometrium          4.8 mm, symmetrical, wnl Right ovary             2.2 x 1.9 x 2.5 cm, wnl Left ovary                3.4 x 1.7 x 3.2 cm, hemorrhagic left ovarian cyst 2.1 x 2 x 1.2 cm No free fluid Technician Comments: PELVIC US TA/TV: homogeneous retroflexed uterus , 1.1 x .7 x 1 cm complex cystic hypervascular area in the right fundal myometrium vs endometrium,EEC 4.8 mm,normal right ovary,hemorrhagic left ovarian cyst 2.1 x 2 x 1.2 cm,no free fluid,Dr Eure reviewed images during ultrasound Chaperone GoogleLily Sobalarro Amber Flora LippsJ Carl 10/09/2020 10:41 AM Clinical Impression and recommendations: I have reviewed the sonogram results above, combined with the patient's current clinical course, below are my impressions and any appropriate recommendations for management based on the sonographic findings. 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

## 2020-10-16 NOTE — Assessment & Plan Note (Signed)
I have reviewed imaging studies with the patient and her husband I have also discussed the case extensively with GYN surgeon I agree that she is considered low risk; MRI of the brain is pending Assuming the MRI result is okay, we will proceed with chemotherapy within a week from now I reviewed several protocols with the patient and family I felt that low-dose daily methotrexate is 0.4mg /kg for 5 days, cycle of every 14 days in general is extremely well-tolerated We did discuss potential risk of pancytopenia, mucositis, infection, minor hair loss, nausea and others and she is in agreement to proceed I will schedule chemo education class in the near future prior to treatment I plan to prescribe folic acid supplementation once we are ready to start treatment I plan to see her before first dose of therapy Her husband typically works night shift and I will try to accommodate his need and schedule appointment either soon after his shift ends at 7:30 AM or latest appointment of the day before he has to return back to work at 11 PM The patient is currently not driving

## 2020-10-16 NOTE — Progress Notes (Signed)
START OFF PATHWAY REGIMEN - Other   Custom Intervention:methotrexate GTD:     Methotrexate   **Always confirm dose/schedule in your pharmacy ordering system**  Patient Characteristics: Intent of Therapy: Curative Intent, Discussed with Patient

## 2020-10-16 NOTE — Progress Notes (Signed)
GYNECOLOGIC ONCOLOGY NEW PATIENT CONSULTATION   Patient Name: Maria [name]  Patient Age: 30 y.o. Date of Service: 10/16/20 Referring Provider: Dr. 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  I reviewed in detail with the patient and her husband (with an interpreter present) the findings including her rising level of hCG as well as MRI and CT results that all point to a diagnosis of gestational trophoblastic neoplasia.  I discussed the etiology of this group of diseases and that her recent molar pregnancy put her at increased risk for developing GTN.  While her lung findings are not impressive, in the setting of rising hCG and her myometrial lesion, I believe that we need to treat them as metastatic disease.  She remains in the low risk category per [facility].  Given pulmonary findings, I also recommended proceeding with an MRI to rule out brain metastases.  We were able to get this scheduled for later this afternoon.  In the setting of metastatic disease, we discussed initiating chemotherapy.  NCCN guidelines supports the use of either methotrexate or actinomycin D in the setting of low risk disease.  I have spoken with Dr. Bertis RuddyGorsuch, and we will plan to initiate the 5-day methotrexate regimen for initial treatment.  This decision was made trying to be mindful of the patient's limited resources, the distance that she and her husband live from the hospital and the fact that she has the only caretaker for their son.  Primary remission rates are expected to be approximately 88% for the multi day methotrexate regimens.  I also discussed the somewhat unusual finding of the myometrial lesions seen on her ultrasound and recent MRI.  I am concerned that this may ultimately require hysterectomy for complete disease treatment.   We will plan to follow her hCG levels.  If she normalizes on chemotherapy, then we can repeat a pelvic MRI.  If no lesion noted in the myometrium, then she may be able to avoid hysterectomy.  If her hCG were to stabilize but not normalized, then we would repeat imaging.  If persistent disease was only noted within the uterus (this myometrial lesion), then I discussed today that I would very likely recommend moving forward with hysterectomy for definitive treatment.  While she and her husband would like to achieve further childbearing, she was receptive to the possible need for hysterectomy and voiced that being able to stay alive for her son was of primary importance.  In terms of treatment, we discussed frequent lab work that would be done for surveillance.  I also stressed the importance of good and reliable birth control to avoid pregnancy.  She has been on oral birth control pills for much of the last 4 years.  She would like to continue on this but we discussed that if she ultimately decides that she would like something longer-term or to not have to take something orally every day, that we could change to a different form of birth control.  A copy of this note was sent to the patient's referring provider.   70 minutes of total time was spent for this patient encounter, including preparation, face-to-face counseling with the patient and coordination of care, and documentation of the encounter.  Eugene GarnetKatherine Tien Aispuro, MD  Division of Gynecologic Oncology  Department of Obstetrics and Gynecology  [facility]  ___________________________________________  Chief Complaint: Chief Complaint  Patient presents with  . Gestational trophoblastic neoplasm    History of Present Illness:  Maria [name] is a 30 y.o. y.o. female who is seen in consultation at the request of Dr. Despina Hidden for an evaluation of gestational trophoblastic neoplasia.  Patient's history is notable for a  pregnancy in 2021 diagnosed with either a missed abortion versus molar pregnancy in October.  She underwent D&C with final pathology confirming a molar pregnancy.  She was followed with beta hCG, somewhat sporadically, with values listed below.  She initially had significant decrease in her beta-hCG within nadir value just under 2000.  In early January, her beta-hCG was noted to be increasing again and most recently was [id number].  At her OB/GYN's office last week, she underwent repeat transvaginal ultrasound with findings concerning for a myometrial lesion.  I recommended pelvic MRI as well as staging CT scan at that time.  Hcg: Component Ref Range & Units 8 d ago  (10/08/20) 2 wk ago  (09/30/20) 1 mo ago  (09/14/20) 2 mo ago  (08/04/20) 2 mo ago  (07/20/20) 3 mo ago  (07/06/20) 3 mo ago  (07/02/20)  hCG Quant mIU/mL 4,433  3,703 CM  1,865 CM  4,282 CM  4,431 CM  108,246 CM  105,417    Patient comes in today with her husband.  She notes overall feeling well since October.  She had a normal menses from 11/18-24 and 12/14-22.  She has not had a menses this month.  The last 2 menstrual cycles that she had both had 1 day that was much heavier than her normal menses.  She used approximately 6 super pads during this heavy day.  She notes cramping with her periods and occasional left-sided cramping/pain.  She endorses a good appetite without nausea or emesis.  She reports normal bowel and bladder function.  She has continued on oral birth control with Isibloom since her procedure.  She denies any missed doses.  Her GYN history is unremarkable.  This was her second pregnancy.  She delivered her son in 2017 and denies any issues with that pregnancy or delivery.  This last pregnancy was a planned pregnancy.  She and her husband live with their son near [place].  She denies any tobacco, alcohol, or recreational drug use.  PAST MEDICAL HISTORY:  Past Medical History:  Diagnosis Date  . Amenorrhea 03/25/2013  .  Encounter for contraceptive management 03/07/2016  . Gestational trophoblastic neoplasm   . History of molar pregnancy   . Medical history non-contributory   . Patient desires pregnancy 04/15/2013  . Pregnant 06/15/2015  . Screening for depression 03/07/2016     PAST SURGICAL HISTORY:  Past Surgical History:  Procedure Laterality Date  . DILATION AND EVACUATION N/A 07/10/2020   Procedure: SUCTION DILATATION AND EVACUATION;  Surgeon: Lazaro Arms, MD;  Location: [facility] OR;  Service: Gynecology;  Laterality: N/A;    OB/GYN HISTORY:  OB History  Gravida Para Term Preterm AB Living  3 1 1     1   SAB IAB Ectopic Multiple Live Births        0 1    # Outcome Date GA Lbr Len/2nd Weight Sex Delivery Anes PTL Lv  3 Term 01/26/16 [redacted]w[redacted]d 01:37 / 00:29 5 lb 11.9 oz (2.605 kg) M Vag-Spont Local  LIV     Birth Comments: skin tag on left ear  2 Gravida           1 [redacted]w[redacted]d  No LMP recorded.  Last pap: 02/2017, negative History of abnormal pap smears: no  MEDICATIONS: Outpatient Encounter Medications as of 10/16/2020  Medication Sig  . desogestrel-ethinyl estradiol (APRI) 0.15-30 MG-MCG tablet Take 1 tablet by mouth daily.  . Ferrous Sulfate (IRON PO) Take 1 tablet by mouth daily.  . Multiple Vitamin (MULTIVITAMIN PO) Take 1 tablet by mouth daily.  . [DISCONTINUED] ibuprofen (ADVIL) 200 MG tablet Take 400 mg by mouth every 6 (six) hours as needed. (Patient not taking: Reported on 10/15/2020)   No facility-administered encounter medications on file as of 10/16/2020.    ALLERGIES:  No Known Allergies   FAMILY HISTORY:  Family History  Problem Relation Age of Onset  . Cancer Mother 74       breast  . Asthma Neg Hx   . Diabetes Neg Hx   . Heart disease Neg Hx   . Hypertension Neg Hx   . Stroke Neg Hx   . Colon cancer Neg Hx   . Ovarian cancer Neg Hx   . Endometrial cancer Neg Hx   . Pancreatic cancer Neg Hx   . Prostate cancer Neg Hx      SOCIAL HISTORY:  Social  Connections: Not on file    REVIEW OF SYSTEMS:  + chills, swelling, pain along posterior aspect of her left leg. Denies appetite changes, fevers, fatigue, unexplained weight changes. Denies hearing loss, neck lumps or masses, mouth sores, ringing in ears or voice changes. Denies cough or wheezing.  Denies shortness of breath. Denies chest pain or palpitations.  Denies abdominal distention, pain, blood in stools, constipation, diarrhea, nausea, vomiting, or early satiety. Denies pain with intercourse, dysuria, frequency, hematuria or incontinence. Denies hot flashes, pelvic pain, vaginal bleeding or vaginal discharge.   Denies joint pain, back pain. Denies itching, rash, or wounds. Denies dizziness, headaches, numbness or seizures. Denies swollen lymph nodes or glands, denies easy bruising or bleeding. Denies anxiety, depression, confusion, or decreased concentration.  Physical Exam:  Vital Signs for this encounter:  Blood pressure (!) 152/85, pulse (!) 128, temperature (!) 96.8 F (36 C), temperature source Tympanic, resp. rate 18, weight 124 lb (56.2 kg), SpO2 100 %, unknown if currently breastfeeding. Body mass index is 23.43 kg/m. General: Alert, oriented, no acute distress.  HEENT: Normocephalic, atraumatic. Sclera anicteric.  Chest: Clear to auscultation bilaterally. No wheezes, rhonchi, or rales. Cardiovascular: Regular rhythm, tachycardic with a heart rate in the low 100s, no murmurs, rubs, or gallops.  Abdomen: Normoactive bowel sounds. Soft, nondistended, nontender to palpation. No masses or hepatosplenomegaly appreciated. No palpable fluid wave.  Extremities: Grossly normal range of motion. Warm, well perfused. No edema bilaterally.  Skin: No rashes or lesions.  Lymphatics: No cervical, supraclavicular, or inguinal adenopathy.  GU:  Normal external female genitalia. No lesions. No discharge or bleeding.             Bladder/urethra:  No lesions or masses, well supported  bladder             Vagina: No lesions or masses.              Cervix: Normal appearing, no lesions.             Uterus: Mildly enlarged and bulbous superiorly, mobile, no parametrial involvement or nodularity.             Adnexa: No masses appreciated.  Rectal: Deferred.  LABORATORY AND RADIOLOGIC DATA:  Outside medical records were reviewed to synthesize the above history, along with  the history and physical obtained during the visit.   Lab Results  Component Value Date   WBC 10.3 10/16/2020   HGB 14.2 10/16/2020   HCT 43.1 10/16/2020   PLT 342 10/16/2020   GLUCOSE 104 (H) 10/16/2020   ALT 13 10/16/2020   AST 16 10/16/2020   NA 137 10/16/2020   K 3.3 (L) 10/16/2020   CL 104 10/16/2020   CREATININE 0.73 10/16/2020   BUN 8 10/16/2020   CO2 21 (L) 10/16/2020   TSH 1.095 10/16/2020   HGBA1C 4.8 08/18/2020   Surgical pathology 10/15, D&C: A. PRODUCTS OF CONCEPTION, DILATATION AND EVACUATION:  - Chorionic villi with features consistent with molar gestation.  COMMENT:  Features favor early complete mole. Dr. Luisa Hart reviewed.  Pelvic ultrasound 1/14: PELVIC US TA/TV: homogeneous retroflexed uterus , 1.1 x .7 x 1 cm complex cystic hypervascular area in the right fundal myometrium vs endometrium,EEC 4.8 mm,normal right ovary,hemorrhagic left ovarian cyst 2.1 x 2 x 1.2 cm,no free fluid,Dr Eure reviewed images during ultrasound   MRI pelvis 1/17: IMPRESSION: 2.4 cm ill-defined hyperenhancing lesion in the right posterior fundal myometrium, suspicious for invasive mole.  CT C/A/P 1/17: IMPRESSION: 1. Solid 5 mm nodule in the left lower lobe, with additional scattered 23mm or smaller pulmonary nodules. Which in the setting of known malignancy are suspicious for metastatic involvement. 2. Please refer to same day MRI of the pelvis for findings regarding uterine gestational trophoblastic disease. 3.  No evidence of metastatic disease in the abdomen or pelvis.

## 2020-10-17 ENCOUNTER — Telehealth: Payer: Self-pay | Admitting: Gynecologic Oncology

## 2020-10-17 LAB — BETA HCG QUANT (REF LAB): hCG Quant: 7584 m[IU]/mL

## 2020-10-17 NOTE — Telephone Encounter (Signed)
Patient called with MRI brain results (negative) and labs. All questions answered.  Eugene Garnet MD Gynecologic Oncology

## 2020-10-19 ENCOUNTER — Other Ambulatory Visit: Payer: Self-pay | Admitting: Oncology

## 2020-10-19 ENCOUNTER — Telehealth: Payer: Self-pay

## 2020-10-19 NOTE — Telephone Encounter (Signed)
Scheduled appt for tomorrow for Dr. Bertis Ruddy, lab and chemo education thru Raynelle Fanning the translator. Per Mable Fill is aware of appt date/time.

## 2020-10-19 NOTE — Progress Notes (Signed)
Gynecologic Oncology Multi-Disciplinary Disposition Conference Note  Date of the Conference: 10/19/2020  Patient Name: Maria [name]  Referring Provider: Dr. Despina Hidden Primary GYN Oncologist: Dr. Pricilla Holm  Stage/Disposition:  Stage I vs stage III gestational trophoblastic neoplasm. Disposition is to multi day chemotherapy for low risk disease.   This Multidisciplinary conference took place involving physicians from Gynecologic Oncology, Medical Oncology, Radiation Oncology, Pathology, Radiology along with the Gynecologic Oncology Nurse Practitioner and RN.  Comprehensive assessment of the patient's malignancy, staging, need for surgery, chemotherapy, radiation therapy, and need for further testing were reviewed. Supportive measures, both inpatient and following discharge were also discussed. The recommended plan of care is documented. Greater than 35 minutes were spent correlating and coordinating this patient's care.

## 2020-10-20 ENCOUNTER — Inpatient Hospital Stay (HOSPITAL_BASED_OUTPATIENT_CLINIC_OR_DEPARTMENT_OTHER): Payer: BC Managed Care – PPO | Admitting: Hematology and Oncology

## 2020-10-20 ENCOUNTER — Ambulatory Visit: Payer: BC Managed Care – PPO

## 2020-10-20 ENCOUNTER — Encounter: Payer: Self-pay | Admitting: Hematology and Oncology

## 2020-10-20 ENCOUNTER — Inpatient Hospital Stay: Payer: BC Managed Care – PPO

## 2020-10-20 ENCOUNTER — Other Ambulatory Visit: Payer: Self-pay

## 2020-10-20 VITALS — BP 142/67 | HR 124 | Temp 97.8°F | Resp 18 | Ht 61.0 in | Wt 125.6 lb

## 2020-10-20 DIAGNOSIS — R918 Other nonspecific abnormal finding of lung field: Secondary | ICD-10-CM | POA: Diagnosis not present

## 2020-10-20 DIAGNOSIS — O019 Hydatidiform mole, unspecified: Secondary | ICD-10-CM | POA: Diagnosis not present

## 2020-10-20 DIAGNOSIS — R9389 Abnormal findings on diagnostic imaging of other specified body structures: Secondary | ICD-10-CM | POA: Diagnosis not present

## 2020-10-20 LAB — CMP (CANCER CENTER ONLY)
ALT: 15 U/L (ref 0–44)
AST: 20 U/L (ref 15–41)
Albumin: 4.4 g/dL (ref 3.5–5.0)
Alkaline Phosphatase: 73 U/L (ref 38–126)
Anion gap: 9 (ref 5–15)
BUN: 6 mg/dL (ref 6–20)
CO2: 25 mmol/L (ref 22–32)
Calcium: 9.7 mg/dL (ref 8.9–10.3)
Chloride: 105 mmol/L (ref 98–111)
Creatinine: 0.73 mg/dL (ref 0.44–1.00)
GFR, Estimated: >60 mL/min (ref >60)
Glucose, Bld: 128 mg/dL — ABNORMAL HIGH (ref 70–99)
Potassium: 3.7 mmol/L (ref 3.5–5.1)
Sodium: 139 mmol/L (ref 135–145)
Total Bilirubin: 0.3 mg/dL (ref 0.3–1.2)
Total Protein: 8.3 g/dL — ABNORMAL HIGH (ref 6.5–8.1)

## 2020-10-20 LAB — CBC WITH DIFFERENTIAL (CANCER CENTER ONLY)
Abs Immature Granulocytes: 0.02 10*3/uL (ref 0.00–0.07)
Basophils Absolute: 0.1 10*3/uL (ref 0.0–0.1)
Basophils Relative: 1 %
Eosinophils Absolute: 0.1 10*3/uL (ref 0.0–0.5)
Eosinophils Relative: 1 %
HCT: 42.3 % (ref 36.0–46.0)
Hemoglobin: 14.3 g/dL (ref 12.0–15.0)
Immature Granulocytes: 0 %
Lymphocytes Relative: 20 %
Lymphs Abs: 1.8 10*3/uL (ref 0.7–4.0)
MCH: 29.4 pg (ref 26.0–34.0)
MCHC: 33.8 g/dL (ref 30.0–36.0)
MCV: 86.9 fL (ref 80.0–100.0)
Monocytes Absolute: 0.5 10*3/uL (ref 0.1–1.0)
Monocytes Relative: 5 %
Neutro Abs: 6.3 10*3/uL (ref 1.7–7.7)
Neutrophils Relative %: 73 %
Platelet Count: 333 10*3/uL (ref 150–400)
RBC: 4.87 MIL/uL (ref 3.87–5.11)
RDW: 13.1 % (ref 11.5–15.5)
WBC Count: 8.7 10*3/uL (ref 4.0–10.5)
nRBC: 0 % (ref 0.0–0.2)

## 2020-10-20 MED ORDER — PROCHLORPERAZINE MALEATE 10 MG PO TABS: 10.0000 mg | ORAL_TABLET | Freq: Four times a day (QID) | ORAL | 1 refills | Status: DC | PRN

## 2020-10-20 MED ORDER — FOLIC ACID 1 MG PO TABS: 1.0000 mg | ORAL_TABLET | Freq: Every day | ORAL | 11 refills | Status: DC

## 2020-10-20 MED ORDER — ONDANSETRON HCL 8 MG PO TABS: 8.0000 mg | ORAL_TABLET | Freq: Three times a day (TID) | ORAL | 1 refills | Status: DC | PRN

## 2020-10-20 NOTE — Progress Notes (Signed)
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 She is considered overall low risk MRI of the brain was negative After extensive discussion, we are in agreement for low-dose, daily methotrexate IM injection for 5 days every cycle of 14 days for curative intent treatment We discussed the risk, benefits, side effects of methotrexate including risk of pancytopenia, infection, mucositis and hair loss and she is in agreement to proceed Due to her social circumstances, I will try to minimize waiting time for treatment She is instructed to take premedication Compazine an hour before each chemo appointment She is instructed to take daily folic acid to avoid risk of pancytopenia She will get tumor marker monitoring every other week Once she has achieved normalization of hCG, we will go 3 more cycles beyond before stopping After that, I plan to order MRI of the pelvis and refer her back to GYN oncologist for further evaluation She is in agreement to proceed with the plan of care She will get another baseline blood drawn today and proceed with chemo class later today   No orders of the defined types were placed in this encounter.   All questions were answered. The patient knows to call the clinic with any problems, questions or concerns. The total time spent in the appointment was 40 minutes encounter with patients including review of chart and various tests results, discussions about plan of care and coordination of care plan   Artis Delay, MD 10/20/2020 3:14 PM  INTERVAL HISTORY: Please see below for problem oriented charting. She returns with her husband for further follow-up Spanish interpreter was present throughout the visit She feels well She has no pelvic pain or vaginal bleeding Her case was  discussed at the tumor board yesterday  SUMMARY OF ONCOLOGIC HISTORY: Maria [name] 30 y.o. 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 Summary of oncologic history is as follows: The patient probably has missed miscarriage late last year She has 1 son born in 2017 The patient is otherwise healthy She is currently not working Her husband, [name], works night shift between 11 PM to 7:30 AM, with sporadic schedule, in general, 6 days block each time  On July 10, 2020, she underwent ultrasound of the pelvis which showed  Complex very thickened  endometrium consistent with possible molar pregnancy, definitely a non viable pregnancy G3P1001 Estimated Date of Delivery: None noted.  Normal general sonographic findings  Her gynecologist performed suction and sharp uterine evacuation, pathology showed FINAL MICROSCOPIC DIAGNOSIS:   A. PRODUCTS OF CONCEPTION, DILATATION AND EVACUATION:  - Chorionic villi with features consistent with molar gestation.  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  Solid 5 mm nodule in the left lower lobe, with additional scattered 46mm or smaller pulmonary nodules. Which in the setting of known malignancy are suspicious for metastatic involvement.  2. Please refer to same day MRI of the pelvis for findings regarding uterine gestational trophoblastic disease.  3.  No evidence of metastatic disease in the abdomen or pelvis.  MRI of the pelvis on October 12, 2020 showed  2.4 cm ill-defined hyperenhancing lesion in the right posterior fundal myometrium, suspicious for invasive mole.  No evidence of extra-uterine metastatic disease.  The patient is not symptomatic She denies pelvic pain or excessive vaginal bleeding Her appetite is normal She desire further pregnancy MRI brain performed on October 16, 2020 showed no evidence of disease  REVIEW OF SYSTEMS:   Constitutional: Denies fevers, chills or abnormal weight loss Eyes: Denies blurriness of vision Ears, nose, mouth, throat, and face: Denies mucositis or sore throat Respiratory: Denies cough, dyspnea or wheezes Cardiovascular: Denies palpitation, chest discomfort or lower extremity swelling Gastrointestinal:  Denies nausea, heartburn or change in bowel habits Skin: Denies abnormal skin rashes Lymphatics: Denies new lymphadenopathy or easy bruising Neurological:Denies numbness, tingling or new weaknesses Behavioral/Psych: Mood is stable, no new changes  All other systems were reviewed with the patient and are negative.  I have reviewed the past medical history, past surgical history, social history and family history with the patient and they are unchanged from previous note.  ALLERGIES:  has No Known Allergies.  MEDICATIONS:  Current Outpatient Medications  Medication Sig Dispense Refill  . folic acid (FOLVITE) 1 MG tablet Take 1 tablet (1 mg total) by mouth daily. 30 tablet 11  . desogestrel-ethinyl estradiol (APRI) 0.15-30 MG-MCG tablet Take 1 tablet by mouth daily. 28 tablet 11  .  Ferrous Sulfate (IRON PO) Take 1 tablet by mouth daily.    . Multiple Vitamin (MULTIVITAMIN PO) Take 1 tablet by mouth daily.    . ondansetron (ZOFRAN) 8 MG tablet Take 1 tablet (8 mg total) by mouth every 8 (eight) hours as needed (Nausea or vomiting). 30 tablet 1  . prochlorperazine (COMPAZINE) 10 MG tablet Take 1 tablet (10 mg total) by mouth every 6 (six) hours as needed (Nausea or vomiting). 30 tablet 1   No current facility-administered medications for this visit.    PHYSICAL EXAMINATION: ECOG PERFORMANCE STATUS: 0 - Asymptomatic  Vitals:   10/20/20 1455  BP: (!) 142/67  Pulse: (!) 124  Resp: 18  Temp: 97.8 F (36.6 C)  SpO2: 97%   Filed Weights   10/20/20 1455  Weight: 125 lb 9.6 oz (57 kg)    GENERAL:alert, no distress and comfortable NEURO: alert & oriented x 3 with fluent speech, no focal motor/sensory deficits  LABORATORY DATA:  I have reviewed the data as listed    Component Value Date/Time   NA 137 10/16/2020 1126   K 3.3 (L) 10/16/2020 1126   CL 104 10/16/2020 1126   CO2 21 (L) 10/16/2020 1126   GLUCOSE 104 (H) 10/16/2020 1126   BUN 8 10/16/2020 1126   CREATININE 0.73 10/16/2020 1126   CREATININE 0.60 08/20/2014 1505   CALCIUM 9.6 10/16/2020 1126   PROT 8.2 (H) 10/16/2020 1126   ALBUMIN 4.4 10/16/2020 1126   AST 16 10/16/2020 1126   ALT 13 10/16/2020 1126   ALKPHOS 80 10/16/2020 1126   BILITOT 0.4 10/16/2020 1126  GFRNONAA >60 10/16/2020 1126    No results found for: SPEP, UPEP  Lab Results  Component Value Date   WBC 10.3 10/16/2020   NEUTROABS 8.1 (H) 10/16/2020   HGB 14.2 10/16/2020   HCT 43.1 10/16/2020   MCV 87.8 10/16/2020   PLT 342 10/16/2020      Chemistry      Component Value Date/Time   NA 137 10/16/2020 1126   K 3.3 (L) 10/16/2020 1126   CL 104 10/16/2020 1126   CO2 21 (L) 10/16/2020 1126   BUN 8 10/16/2020 1126   CREATININE 0.73 10/16/2020 1126   CREATININE 0.60 08/20/2014 1505      Component Value Date/Time    CALCIUM 9.6 10/16/2020 1126   ALKPHOS 80 10/16/2020 1126   AST 16 10/16/2020 1126   ALT 13 10/16/2020 1126   BILITOT 0.4 10/16/2020 1126       RADIOGRAPHIC STUDIES: I have personally reviewed the radiological images as listed and agreed with the findings in the report. MR BRAIN W WO CONTRAST  Result Date: 10/16/2020 CLINICAL DATA:  Gestational trophoblastic neoplasia.  Staging EXAM: MRI HEAD WITHOUT AND WITH CONTRAST TECHNIQUE: Multiplanar, multiecho pulse sequences of the brain and surrounding structures were obtained without and with intravenous contrast. CONTRAST:  6mL GADAVIST GADOBUTROL 1 MMOL/ML IV SOLN COMPARISON:  None. FINDINGS: Brain: No acute infarction, hemorrhage, hydrocephalus, extra-axial collection or mass lesion. Normal enhancement. Negative for metastatic disease. No brain edema. Vascular: Normal arterial flow voids Skull and upper cervical spine: No skeletal lesion. Sinuses/Orbits: Paranasal sinuses clear.  Negative orbit Other: None IMPRESSION: Normal MRI head with contrast. Electronically Signed   By: Marlan Palauharles  Clark M.D.   On: 10/16/2020 16:38   MR PELVIS W WO CONTRAST  Result Date: 10/12/2020 CLINICAL DATA:  Gestational trophoblastic neoplasia.  Staging. EXAM: MRI PELVIS WITHOUT AND WITH CONTRAST TECHNIQUE: Multiplanar multisequence MR imaging of the pelvis was performed both before and after administration of intravenous contrast. CONTRAST:  7mL GADAVIST GADOBUTROL 1 MMOL/ML IV SOLN COMPARISON:  None. FINDINGS: Lower Urinary Tract: No urinary bladder or urethral abnormality identified. Bowel: Unremarkable pelvic bowel loops. Vascular/Lymphatic: Unremarkable. No pathologically enlarged pelvic lymph nodes identified. Reproductive: -- Uterus: Measures 9.3 x 3.9 x 6.4 cm (volume = 120 cm^3). A poorly defined area is seen in the right posterior fundal myometrium, which shows prominent internal vascularity and hyperenhancement. This measures approximately 2.4 x 2.4 x 2.3 cm, and is  suspicious for invasive mole. There is no evidence of extension through the uterine serosa. No mass or fluid is seen within the endometrial cavity. Cervix and vagina are unremarkable. -- Right ovary: Appears normal. No ovarian or adnexal masses identified. -- Left ovary: Appears normal. No ovarian or adnexal masses identified. Other: No peritoneal thickening or abnormal free fluid. Musculoskeletal:  Unremarkable. IMPRESSION: 2.4 cm ill-defined hyperenhancing lesion in the right posterior fundal myometrium, suspicious for invasive mole. No evidence of extra-uterine metastatic disease. Electronically Signed   By: Danae OrleansJohn A Stahl M.D.   On: 10/12/2020 11:50   CT CHEST ABDOMEN PELVIS W CONTRAST  Result Date: 10/12/2020 CLINICAL DATA:  History of gestational trophoblastic disease, staging for extra uterine disease and persistent disease in the myometrium. History of molar pregnancy in the myometrium ultrasound on 10/09/2020 showing persistence/recurrence gestational trophoblastic disease. EXAM: CT CHEST, ABDOMEN, AND PELVIS WITH CONTRAST TECHNIQUE: Multidetector CT imaging of the chest, abdomen and pelvis was performed following the standard protocol during bolus administration of intravenous contrast. CONTRAST:  75mL OMNIPAQUE IOHEXOL 300 MG/ML  SOLN COMPARISON:  Pelvic ultrasound July 10, 2020 and October 09, 2020, MRI pelvis October 12, 2020. FINDINGS: CT CHEST FINDINGS Cardiovascular: No significant vascular findings. Normal heart size. No pericardial effusion. Mediastinum/Nodes: No enlarged mediastinal, hilar, or axillary lymph nodes. Thyroid gland, trachea, and esophagus demonstrate no significant findings. Lungs/Pleura: Solid 5 mm nodule in the left lower lobe (series 4, image 98). There are additional scattered tiny pulmonary nodules which measure 2 mm or smaller for instance in the left upper lobe on series 4 image 35, image 42, and 62. No pleural effusion or pneumothorax. Musculoskeletal: No chest wall mass  or suspicious bone lesions identified. CT ABDOMEN PELVIS FINDINGS Hepatobiliary: No focal liver abnormality is seen. No gallstones, gallbladder wall thickening, or biliary dilatation. Pancreas: Unremarkable. No pancreatic ductal dilatation or surrounding inflammatory changes. Spleen: Normal in size without focal abnormality. Adrenals/Urinary Tract: Adrenal glands are unremarkable. Kidneys are normal, without renal calculi, focal lesion, or hydronephrosis. Bladder is unremarkable. Stomach/Bowel: Stomach is within normal limits. Appendix appears normal. No evidence of bowel wall thickening, distention, or inflammatory changes. Vascular/Lymphatic: No significant vascular findings are present. No enlarged abdominal or pelvic lymph nodes. Reproductive: Please refer to same day MRI of the pelvis findings regarding the uterus/gestational trophoblastic disease. Other: No abdominal wall hernia or abnormality. No abdominopelvic ascites. Musculoskeletal: No suspicious lytic or blastic lesions of bone. No acute osseous abnormality IMPRESSION: 1. Solid 5 mm nodule in the left lower lobe, with additional scattered 2mm or smaller pulmonary nodules. Which in the setting of known malignancy are suspicious for metastatic involvement. 2. Please refer to same day MRI of the pelvis for findings regarding uterine gestational trophoblastic disease. 3.  No evidence of metastatic disease in the abdomen or pelvis. Electronically Signed   By: Maudry MayhewJeffrey  Waltz MD   On: 10/12/2020 12:23   US PELVIC COMPLETE WITH TRANSVAGINAL  Result Date: 10/09/2020  [facility] [address] [place] [id number]                                                                                                                                   GYNECOLOGIC SONOGRAM Maria EvenerDulce [name] is a 30 y.o. G3P1001 No LMP recorded. for a pelvic sonogram for HX of molar pregnancy. Uterus                      6.3 x 5.6 x 4.1 cm, Total uterine  volume 71 cc, homogeneous retroflexed uterus , 1.1 x .7 x 1 cm complex cystic hypervascular area in the right fundal myometrium vs endometrium Endometrium          4.8 mm, symmetrical, wnl Right ovary             2.2 x 1.9 x 2.5 cm, wnl Left ovary                3.4 x 1.7 x 3.2 cm, hemorrhagic left ovarian  cyst 2.1 x 2 x 1.2 cm No free fluid Technician Comments: PELVIC US TA/TV: homogeneous retroflexed uterus , 1.1 x .7 x 1 cm complex cystic hypervascular area in the right fundal myometrium vs endometrium,EEC 4.8 mm,normal right ovary,hemorrhagic left ovarian cyst 2.1 x 2 x 1.2 cm,no free fluid,Dr Eure reviewed images during ultrasound Chaperone [employer] Flora Lipps 10/09/2020 10:41 AM Clinical Impression and recommendations: I have reviewed the sonogram results above, combined with the patient's current clinical course, below are my impressions and any appropriate recommendations for management based on the sonographic findings. 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

## 2020-10-20 NOTE — Assessment & Plan Note (Signed)
Her case was extensively discussed at the recent tumor board She is considered overall low risk MRI of the brain was negative After extensive discussion, we are in agreement for low-dose, daily methotrexate IM injection for 5 days every cycle of 14 days for curative intent treatment We discussed the risk, benefits, side effects of methotrexate including risk of pancytopenia, infection, mucositis and hair loss and she is in agreement to proceed Due to her social circumstances, I will try to minimize waiting time for treatment She is instructed to take premedication Compazine an hour before each chemo appointment She is instructed to take daily folic acid to avoid risk of pancytopenia She will get tumor marker monitoring every other week Once she has achieved normalization of hCG, we will go 3 more cycles beyond before stopping After that, I plan to order MRI of the pelvis and refer her back to GYN oncologist for further evaluation She is in agreement to proceed with the plan of care She will get another baseline blood drawn today and proceed with chemo class later today

## 2020-10-21 LAB — BETA HCG QUANT (REF LAB): hCG Quant: 12299 m[IU]/mL

## 2020-10-23 ENCOUNTER — Telehealth: Payer: Self-pay | Admitting: Hematology and Oncology

## 2020-10-23 ENCOUNTER — Other Ambulatory Visit: Payer: Self-pay | Admitting: Hematology and Oncology

## 2020-10-23 NOTE — Telephone Encounter (Signed)
Scheduled appt per 1/25 sch msg - left message for patient with appt date and time for appts next wk.

## 2020-10-26 ENCOUNTER — Other Ambulatory Visit: Payer: Self-pay

## 2020-10-26 ENCOUNTER — Inpatient Hospital Stay: Payer: BC Managed Care – PPO

## 2020-10-26 VITALS — BP 142/68 | HR 98 | Temp 98.2°F | Resp 18

## 2020-10-26 DIAGNOSIS — R918 Other nonspecific abnormal finding of lung field: Secondary | ICD-10-CM | POA: Diagnosis not present

## 2020-10-26 DIAGNOSIS — R9389 Abnormal findings on diagnostic imaging of other specified body structures: Secondary | ICD-10-CM | POA: Diagnosis not present

## 2020-10-26 DIAGNOSIS — O019 Hydatidiform mole, unspecified: Secondary | ICD-10-CM

## 2020-10-26 MED ORDER — METHOTREXATE SODIUM (PF) CHEMO INJECTION 250 MG/10ML
22.0000 mg | Freq: Once | INTRAMUSCULAR | Status: AC
  Administered 2020-10-26: 22 mg via INTRAMUSCULAR
  Filled 2020-10-26: qty 0.88

## 2020-10-26 NOTE — Patient Instructions (Signed)
[facility] Discharge Instructions for Patients Receiving Chemotherapy  Today you received the following chemotherapy agents Methotrexate   To help prevent nausea and vomiting after your treatment, we encourage you to take your nausea medication as directed by MD    If you develop nausea and vomiting that is not controlled by your nausea medication, call the clinic.   BELOW ARE SYMPTOMS THAT SHOULD BE REPORTED IMMEDIATELY:  *FEVER GREATER THAN 100.5 F  *CHILLS WITH OR WITHOUT FEVER  NAUSEA AND VOMITING THAT IS NOT CONTROLLED WITH YOUR NAUSEA MEDICATION  *UNUSUAL SHORTNESS OF BREATH  *UNUSUAL BRUISING OR BLEEDING  TENDERNESS IN MOUTH AND THROAT WITH OR WITHOUT PRESENCE OF ULCERS  *URINARY PROBLEMS  *BOWEL PROBLEMS  UNUSUAL RASH Items with * indicate a potential emergency and should be followed up as soon as possible.  Feel free to call the clinic should you have any questions or concerns. The clinic phone number is [phone].  Please show the CHEMO ALERT CARD at check-in to the Emergency Department and triage nurse.

## 2020-10-26 NOTE — Progress Notes (Signed)
Pt discharged in no apparent distress. Pt left ambulatory without assistance. Pt aware of discharge instructions and verbalized understanding and had no further questions.  

## 2020-10-27 ENCOUNTER — Inpatient Hospital Stay: Payer: BC Managed Care – PPO | Attending: Gynecologic Oncology

## 2020-10-27 ENCOUNTER — Other Ambulatory Visit: Payer: Self-pay

## 2020-10-27 VITALS — BP 134/72 | HR 115 | Temp 97.9°F | Resp 18

## 2020-10-27 DIAGNOSIS — O019 Hydatidiform mole, unspecified: Secondary | ICD-10-CM | POA: Diagnosis not present

## 2020-10-27 MED ORDER — METHOTREXATE SODIUM (PF) CHEMO INJECTION 250 MG/10ML
22.0000 mg | Freq: Once | INTRAMUSCULAR | Status: AC
  Administered 2020-10-27: 22 mg via INTRAMUSCULAR
  Filled 2020-10-27: qty 0.88

## 2020-10-27 NOTE — Patient Instructions (Signed)
[facility] Discharge Instructions for Patients Receiving Chemotherapy  Today you received the following chemotherapy agents: methotrexate.  To help prevent nausea and vomiting after your treatment, we encourage you to take your nausea medication as directed.   If you develop nausea and vomiting that is not controlled by your nausea medication, call the clinic.   BELOW ARE SYMPTOMS THAT SHOULD BE REPORTED IMMEDIATELY:  *FEVER GREATER THAN 100.5 F  *CHILLS WITH OR WITHOUT FEVER  NAUSEA AND VOMITING THAT IS NOT CONTROLLED WITH YOUR NAUSEA MEDICATION  *UNUSUAL SHORTNESS OF BREATH  *UNUSUAL BRUISING OR BLEEDING  TENDERNESS IN MOUTH AND THROAT WITH OR WITHOUT PRESENCE OF ULCERS  *URINARY PROBLEMS  *BOWEL PROBLEMS  UNUSUAL RASH Items with * indicate a potential emergency and should be followed up as soon as possible.  Feel free to call the clinic should you have any questions or concerns. The clinic phone number is [phone].  Please show the CHEMO ALERT CARD at check-in to the Emergency Department and triage nurse.   

## 2020-10-28 ENCOUNTER — Other Ambulatory Visit: Payer: Self-pay

## 2020-10-28 ENCOUNTER — Inpatient Hospital Stay: Payer: BC Managed Care – PPO

## 2020-10-28 ENCOUNTER — Encounter: Payer: Self-pay | Admitting: Hematology and Oncology

## 2020-10-28 VITALS — BP 140/87 | HR 94 | Temp 98.6°F | Resp 18

## 2020-10-28 DIAGNOSIS — O019 Hydatidiform mole, unspecified: Secondary | ICD-10-CM | POA: Diagnosis not present

## 2020-10-28 MED ORDER — METHOTREXATE SODIUM (PF) CHEMO INJECTION 250 MG/10ML
22.0000 mg | Freq: Once | INTRAMUSCULAR | Status: AC
  Administered 2020-10-28: 22 mg via INTRAMUSCULAR
  Filled 2020-10-28: qty 0.88

## 2020-10-28 NOTE — Progress Notes (Signed)
Met with patient/accompanying adult at registration to introduce myself as Arboriculturist and to offer available resources.  Discussed one-time $1000 Radio broadcast assistant to assist with personal expenses while going though treatment. Advised what is needed to apply.  She has my card if interested in applying and for any addtional financial questions or concerns.  Will meet on 2/14 with interpreter as well to be sure she understands.

## 2020-10-28 NOTE — Patient Instructions (Signed)
[facility] Discharge Instructions for Patients Receiving Chemotherapy  Today you received the following chemotherapy agents Methotrexate To help prevent nausea and vomiting after your treatment, we encourage you to take your nausea medication as prescribed.   If you develop nausea and vomiting that is not controlled by your nausea medication, call the clinic.   BELOW ARE SYMPTOMS THAT SHOULD BE REPORTED IMMEDIATELY:  *FEVER GREATER THAN 100.5 F  *CHILLS WITH OR WITHOUT FEVER  NAUSEA AND VOMITING THAT IS NOT CONTROLLED WITH YOUR NAUSEA MEDICATION  *UNUSUAL SHORTNESS OF BREATH  *UNUSUAL BRUISING OR BLEEDING  TENDERNESS IN MOUTH AND THROAT WITH OR WITHOUT PRESENCE OF ULCERS  *URINARY PROBLEMS  *BOWEL PROBLEMS  UNUSUAL RASH Items with * indicate a potential emergency and should be followed up as soon as possible.  Feel free to call the clinic should you have any questions or concerns. The clinic phone number is [phone].  Please show the CHEMO ALERT CARD at check-in to the Emergency Department and triage nurse.   

## 2020-10-29 ENCOUNTER — Other Ambulatory Visit: Payer: Self-pay

## 2020-10-29 ENCOUNTER — Inpatient Hospital Stay: Payer: BC Managed Care – PPO

## 2020-10-29 VITALS — BP 142/80 | HR 88 | Temp 98.2°F | Resp 18

## 2020-10-29 DIAGNOSIS — O019 Hydatidiform mole, unspecified: Secondary | ICD-10-CM | POA: Diagnosis not present

## 2020-10-29 MED ORDER — METHOTREXATE SODIUM (PF) CHEMO INJECTION 250 MG/10ML
22.0000 mg | Freq: Once | INTRAMUSCULAR | Status: AC
  Administered 2020-10-29: 22 mg via INTRAMUSCULAR
  Filled 2020-10-29: qty 0.88

## 2020-10-29 NOTE — Progress Notes (Signed)
Pt discharged in no apparent distress. Pt left ambulatory without assistance. Pt aware of discharge instructions and verbalized understanding and had no further questions.  

## 2020-10-29 NOTE — Patient Instructions (Signed)
[facility] Discharge Instructions for Patients Receiving Chemotherapy  Today you received the following chemotherapy agents Methotrexate To help prevent nausea and vomiting after your treatment, we encourage you to take your nausea medication as prescribed.   If you develop nausea and vomiting that is not controlled by your nausea medication, call the clinic.   BELOW ARE SYMPTOMS THAT SHOULD BE REPORTED IMMEDIATELY:  *FEVER GREATER THAN 100.5 F  *CHILLS WITH OR WITHOUT FEVER  NAUSEA AND VOMITING THAT IS NOT CONTROLLED WITH YOUR NAUSEA MEDICATION  *UNUSUAL SHORTNESS OF BREATH  *UNUSUAL BRUISING OR BLEEDING  TENDERNESS IN MOUTH AND THROAT WITH OR WITHOUT PRESENCE OF ULCERS  *URINARY PROBLEMS  *BOWEL PROBLEMS  UNUSUAL RASH Items with * indicate a potential emergency and should be followed up as soon as possible.  Feel free to call the clinic should you have any questions or concerns. The clinic phone number is [phone].  Please show the CHEMO ALERT CARD at check-in to the Emergency Department and triage nurse.   

## 2020-10-30 ENCOUNTER — Inpatient Hospital Stay: Payer: BC Managed Care – PPO

## 2020-10-30 ENCOUNTER — Other Ambulatory Visit: Payer: Self-pay

## 2020-10-30 VITALS — BP 155/73 | HR 95 | Temp 98.5°F | Resp 17

## 2020-10-30 DIAGNOSIS — O019 Hydatidiform mole, unspecified: Secondary | ICD-10-CM | POA: Diagnosis not present

## 2020-10-30 MED ORDER — METHOTREXATE SODIUM (PF) CHEMO INJECTION 250 MG/10ML
22.0000 mg | Freq: Once | INTRAMUSCULAR | Status: AC
  Administered 2020-10-30: 22 mg via INTRAMUSCULAR
  Filled 2020-10-30: qty 0.88

## 2020-11-09 ENCOUNTER — Inpatient Hospital Stay: Payer: BC Managed Care – PPO

## 2020-11-09 ENCOUNTER — Encounter: Payer: Self-pay | Admitting: Hematology and Oncology

## 2020-11-09 ENCOUNTER — Other Ambulatory Visit: Payer: Self-pay

## 2020-11-09 ENCOUNTER — Inpatient Hospital Stay (HOSPITAL_BASED_OUTPATIENT_CLINIC_OR_DEPARTMENT_OTHER): Payer: BC Managed Care – PPO | Admitting: Hematology and Oncology

## 2020-11-09 ENCOUNTER — Telehealth: Payer: Self-pay | Admitting: Hematology and Oncology

## 2020-11-09 VITALS — HR 100

## 2020-11-09 DIAGNOSIS — O019 Hydatidiform mole, unspecified: Secondary | ICD-10-CM

## 2020-11-09 DIAGNOSIS — R030 Elevated blood-pressure reading, without diagnosis of hypertension: Secondary | ICD-10-CM | POA: Diagnosis not present

## 2020-11-09 DIAGNOSIS — Z299 Encounter for prophylactic measures, unspecified: Secondary | ICD-10-CM | POA: Diagnosis not present

## 2020-11-09 LAB — CMP (CANCER CENTER ONLY)
ALT: 20 U/L (ref 0–44)
AST: 19 U/L (ref 15–41)
Albumin: 4.3 g/dL (ref 3.5–5.0)
Alkaline Phosphatase: 76 U/L (ref 38–126)
Anion gap: 9 (ref 5–15)
BUN: 8 mg/dL (ref 6–20)
CO2: 24 mmol/L (ref 22–32)
Calcium: 9.7 mg/dL (ref 8.9–10.3)
Chloride: 105 mmol/L (ref 98–111)
Creatinine: 0.68 mg/dL (ref 0.44–1.00)
GFR, Estimated: >60 mL/min (ref >60)
Glucose, Bld: 110 mg/dL — ABNORMAL HIGH (ref 70–99)
Potassium: 3.8 mmol/L (ref 3.5–5.1)
Sodium: 138 mmol/L (ref 135–145)
Total Bilirubin: 0.3 mg/dL (ref 0.3–1.2)
Total Protein: 8 g/dL (ref 6.5–8.1)

## 2020-11-09 LAB — CBC WITH DIFFERENTIAL (CANCER CENTER ONLY)
Abs Immature Granulocytes: 0.02 10*3/uL (ref 0.00–0.07)
Basophils Absolute: 0.1 10*3/uL (ref 0.0–0.1)
Basophils Relative: 1 %
Eosinophils Absolute: 0.1 10*3/uL (ref 0.0–0.5)
Eosinophils Relative: 2 %
HCT: 40.2 % (ref 36.0–46.0)
Hemoglobin: 13.6 g/dL (ref 12.0–15.0)
Immature Granulocytes: 0 %
Lymphocytes Relative: 27 %
Lymphs Abs: 2.2 10*3/uL (ref 0.7–4.0)
MCH: 29.4 pg (ref 26.0–34.0)
MCHC: 33.8 g/dL (ref 30.0–36.0)
MCV: 86.8 fL (ref 80.0–100.0)
Monocytes Absolute: 0.6 10*3/uL (ref 0.1–1.0)
Monocytes Relative: 7 %
Neutro Abs: 5 10*3/uL (ref 1.7–7.7)
Neutrophils Relative %: 63 %
Platelet Count: 357 10*3/uL (ref 150–400)
RBC: 4.63 MIL/uL (ref 3.87–5.11)
RDW: 13.9 % (ref 11.5–15.5)
WBC Count: 8 10*3/uL (ref 4.0–10.5)
nRBC: 0 % (ref 0.0–0.2)

## 2020-11-09 MED ORDER — METHOTREXATE SODIUM (PF) CHEMO INJECTION 250 MG/10ML
22.0000 mg | Freq: Once | INTRAMUSCULAR | Status: AC
  Administered 2020-11-09: 22 mg via INTRAMUSCULAR
  Filled 2020-11-09: qty 0.88

## 2020-11-09 NOTE — Assessment & Plan Note (Signed)
This could be due to anxiety Observe for now 

## 2020-11-09 NOTE — Patient Instructions (Signed)
[facility] Discharge Instructions for Patients Receiving Chemotherapy  Today you received the following chemotherapy agents: methotrexate.  To help prevent nausea and vomiting after your treatment, we encourage you to take your nausea medication as directed.   If you develop nausea and vomiting that is not controlled by your nausea medication, call the clinic.   BELOW ARE SYMPTOMS THAT SHOULD BE REPORTED IMMEDIATELY:  *FEVER GREATER THAN 100.5 F  *CHILLS WITH OR WITHOUT FEVER  NAUSEA AND VOMITING THAT IS NOT CONTROLLED WITH YOUR NAUSEA MEDICATION  *UNUSUAL SHORTNESS OF BREATH  *UNUSUAL BRUISING OR BLEEDING  TENDERNESS IN MOUTH AND THROAT WITH OR WITHOUT PRESENCE OF ULCERS  *URINARY PROBLEMS  *BOWEL PROBLEMS  UNUSUAL RASH Items with * indicate a potential emergency and should be followed up as soon as possible.  Feel free to call the clinic should you have any questions or concerns. The clinic phone number is [phone].  Please show the CHEMO ALERT CARD at check-in to the Emergency Department and triage nurse.   

## 2020-11-09 NOTE — Telephone Encounter (Signed)
Scheduled appts per 2/14 sch msg. Gave pt a print out of AVS.  

## 2020-11-09 NOTE — Progress Notes (Signed)
Met with patient w/ interpreter(Julie) to explain again letter of support needed to apply for one-time $1000 [employer] grant to assist with personal expenses while going through treatment.  Gave her my card to provide information to and for any additional financial questions or concerns.

## 2020-11-09 NOTE — Assessment & Plan Note (Signed)
So far, she tolerated treatment very well without major side effects such as nausea Due to her social circumstances, I will try to minimize waiting time for treatment She is instructed to take premedication Compazine an hour before each chemo appointment She is instructed to take daily folic acid to avoid risk of pancytopenia She will get tumor marker monitoring every other week Once she has achieved normalization of hCG, we will go 3 more cycles beyond before stopping I will call her with results of her hCG tomorrow After that, I plan to order MRI of the pelvis and refer her back to GYN oncologist for further evaluation She is in agreement to proceed with the plan of care

## 2020-11-09 NOTE — Progress Notes (Signed)
[name] OFFICE PROGRESS NOTE  Patient Care Team: Patient, No Pcp Per as PCP - General (General Practice) Maria [name], Maria Snare, RN as Oncology Nurse Navigator (Oncology)  ASSESSMENT & PLAN:  Gestational trophoblastic neoplasm So far, she tolerated treatment very well without major side effects such as nausea Due to her social circumstances, I will try to minimize waiting time for treatment She is instructed to take premedication Compazine an hour before each chemo appointment She is instructed to take daily folic acid to avoid risk of pancytopenia She will get tumor marker monitoring every other week Once she has achieved normalization of hCG, we will go 3 more cycles beyond before stopping I will call her with results of her hCG tomorrow After that, I plan to order MRI of the pelvis and refer her back to GYN oncologist for further evaluation She is in agreement to proceed with the plan of care   Elevated BP without diagnosis of hypertension This could be due to anxiety Observe for now  Preventive measure I encouraged her to proceed with Covid vaccine booster injection   No orders of the defined types were placed in this encounter.   All questions were answered. The patient knows to call the clinic with any problems, questions or concerns. The total time spent in the appointment was 20 minutes encounter with patients including review of chart and various tests results, discussions about plan of care and coordination of care plan   Maria Delay, MD 11/09/2020 3:22 PM  INTERVAL HISTORY: Please see below for problem oriented charting. She tolerated chemotherapy well She has occasional nonproductive cough but no fever or chills Denies nausea She had recent menstrual cycle without cramps I verify she is taking her contraceptives as prescribed  SUMMARY OF ONCOLOGIC HISTORY: Maria [name] 30 y.o. 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 Summary of oncologic history is as follows: The patient probably has missed miscarriage late last year She has 1 son born in 2017 The patient is otherwise healthy She is currently not working Her husband, Maria [name], works night shift between 11 PM to 7:30 AM, with sporadic schedule, in general, 6 days block each time  On July 10, 2020, she underwent ultrasound of the pelvis which showed  Complex very thickened  endometrium consistent with possible molar pregnancy, definitely a non viable pregnancy [id number] Estimated Date of Delivery: None noted.  Normal general sonographic findings  Her gynecologist performed suction and sharp uterine evacuation, pathology showed FINAL MICROSCOPIC DIAGNOSIS:   A. PRODUCTS OF CONCEPTION, DILATATION AND EVACUATION:  - Chorionic villi with features consistent with molar gestation.  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 Solid 5 mm nodule in the  left lower lobe, with additional scattered 45mm or smaller pulmonary nodules. Which in the setting of known malignancy are suspicious for metastatic  involvement.  2. Please refer to same day MRI of the pelvis for findings regarding uterine gestational trophoblastic disease.  3.  No evidence of metastatic disease in the abdomen or pelvis.  MRI of the pelvis on October 12, 2020 showed  2.4 cm ill-defined hyperenhancing lesion in the right posterior fundal myometrium, suspicious for invasive mole.  No evidence of extra-uterine metastatic disease.  The patient is not symptomatic She denies pelvic pain or excessive vaginal bleeding Her appetite is normal She desire further pregnancy MRI brain performed on October 16, 2020 showed no evidence of disease On October 26, 2020, she will proceed with cycle 1 of methotrexate  REVIEW OF SYSTEMS:   Constitutional: Denies fevers, chills or abnormal weight loss Eyes: Denies blurriness of vision Ears, nose, mouth, throat, and face: Denies mucositis or sore throat Respiratory: Denies cough, dyspnea or wheezes Cardiovascular: Denies palpitation, chest discomfort or lower extremity swelling Gastrointestinal:  Denies nausea, heartburn or change in bowel habits Skin: Denies abnormal skin rashes Lymphatics: Denies new lymphadenopathy or easy bruising Neurological:Denies numbness, tingling or new weaknesses Behavioral/Psych: Mood is stable, no new changes  All other systems were reviewed with the patient and are negative.  I have reviewed the past medical history, past surgical history, social history and family history with the patient and they are unchanged from previous note.  ALLERGIES:  has No Known Allergies.  MEDICATIONS:  Current Outpatient Medications  Medication Sig Dispense Refill  . desogestrel-ethinyl estradiol (APRI) 0.15-30 MG-MCG tablet Take 1 tablet by mouth daily. 28 tablet 11  . Ferrous Sulfate (IRON PO) Take 1 tablet by mouth daily.    . folic acid (FOLVITE) 1 MG tablet Take 1 tablet (1 mg total) by mouth daily. 30 tablet 11  . Multiple Vitamin (MULTIVITAMIN PO) Take 1  tablet by mouth daily.    . ondansetron (ZOFRAN) 8 MG tablet Take 1 tablet (8 mg total) by mouth every 8 (eight) hours as needed (Nausea or vomiting). 30 tablet 1  . prochlorperazine (COMPAZINE) 10 MG tablet Take 1 tablet (10 mg total) by mouth every 6 (six) hours as needed (Nausea or vomiting). 30 tablet 1   No current facility-administered medications for this visit.    PHYSICAL EXAMINATION: ECOG PERFORMANCE STATUS: 0 - Asymptomatic  Vitals:   11/09/20 1504  BP: (!) 154/81  Pulse: (!) 134  Resp: 20  Temp: (!) 97.5 F (36.4 C)  SpO2: 100%   Filed Weights   11/09/20 1504  Weight: 129 lb 6.4 oz (58.7 kg)    GENERAL:alert, no distress and comfortable Musculoskeletal:no cyanosis of digits and no clubbing  NEURO: alert & oriented x 3 with fluent speech, no focal motor/sensory deficits  LABORATORY DATA:  I have reviewed the data as listed    Component Value Date/Time   NA 138 11/09/2020 1438   K 3.8 11/09/2020 1438   CL 105 11/09/2020 1438   CO2 24 11/09/2020 1438   GLUCOSE 110 (H) 11/09/2020 1438   BUN 8 11/09/2020 1438   CREATININE 0.68 11/09/2020 1438   CREATININE 0.60 08/20/2014 1505   CALCIUM 9.7 11/09/2020 1438   PROT 8.0 11/09/2020 1438   ALBUMIN 4.3 11/09/2020 1438   AST 19 11/09/2020 1438   ALT 20 11/09/2020 1438   ALKPHOS 76 11/09/2020 1438   BILITOT 0.3 11/09/2020 1438   GFRNONAA >60 11/09/2020 1438    No  results found for: SPEP, UPEP  Lab Results  Component Value Date   WBC 8.0 11/09/2020   NEUTROABS 5.0 11/09/2020   HGB 13.6 11/09/2020   HCT 40.2 11/09/2020   MCV 86.8 11/09/2020   PLT 357 11/09/2020      Chemistry      Component Value Date/Time   NA 138 11/09/2020 1438   K 3.8 11/09/2020 1438   CL 105 11/09/2020 1438   CO2 24 11/09/2020 1438   BUN 8 11/09/2020 1438   CREATININE 0.68 11/09/2020 1438   CREATININE 0.60 08/20/2014 1505      Component Value Date/Time   CALCIUM 9.7 11/09/2020 1438   ALKPHOS 76 11/09/2020 1438   AST 19  11/09/2020 1438   ALT 20 11/09/2020 1438   BILITOT 0.3 11/09/2020 1438       RADIOGRAPHIC STUDIES: I have personally reviewed the radiological images as listed and agreed with the findings in the report. MR BRAIN W WO CONTRAST  Result Date: 10/16/2020 CLINICAL DATA:  Gestational trophoblastic neoplasia.  Staging EXAM: MRI HEAD WITHOUT AND WITH CONTRAST TECHNIQUE: Multiplanar, multiecho pulse sequences of the brain and surrounding structures were obtained without and with intravenous contrast. CONTRAST:  6mL GADAVIST GADOBUTROL 1 MMOL/ML IV SOLN COMPARISON:  None. FINDINGS: Brain: No acute infarction, hemorrhage, hydrocephalus, extra-axial collection or mass lesion. Normal enhancement. Negative for metastatic disease. No brain edema. Vascular: Normal arterial flow voids Skull and upper cervical spine: No skeletal lesion. Sinuses/Orbits: Paranasal sinuses clear.  Negative orbit Other: None IMPRESSION: Normal MRI head with contrast. Electronically Signed   By: Marlan Palauharles  Clark M.D.   On: 10/16/2020 16:38   MR PELVIS W WO CONTRAST  Result Date: 10/12/2020 CLINICAL DATA:  Gestational trophoblastic neoplasia.  Staging. EXAM: MRI PELVIS WITHOUT AND WITH CONTRAST TECHNIQUE: Multiplanar multisequence MR imaging of the pelvis was performed both before and after administration of intravenous contrast. CONTRAST:  7mL GADAVIST GADOBUTROL 1 MMOL/ML IV SOLN COMPARISON:  None. FINDINGS: Lower Urinary Tract: No urinary bladder or urethral abnormality identified. Bowel: Unremarkable pelvic bowel loops. Vascular/Lymphatic: Unremarkable. No pathologically enlarged pelvic lymph nodes identified. Reproductive: -- Uterus: Measures 9.3 x 3.9 x 6.4 cm (volume = 120 cm^3). A poorly defined area is seen in the right posterior fundal myometrium, which shows prominent internal vascularity and hyperenhancement. This measures approximately 2.4 x 2.4 x 2.3 cm, and is suspicious for invasive mole. There is no evidence of extension  through the uterine serosa. No mass or fluid is seen within the endometrial cavity. Cervix and vagina are unremarkable. -- Right ovary: Appears normal. No ovarian or adnexal masses identified. -- Left ovary: Appears normal. No ovarian or adnexal masses identified. Other: No peritoneal thickening or abnormal free fluid. Musculoskeletal:  Unremarkable. IMPRESSION: 2.4 cm ill-defined hyperenhancing lesion in the right posterior fundal myometrium, suspicious for invasive mole. No evidence of extra-uterine metastatic disease. Electronically Signed   By: Danae OrleansJohn A Stahl M.D.   On: 10/12/2020 11:50   CT CHEST ABDOMEN PELVIS W CONTRAST  Result Date: 10/12/2020 CLINICAL DATA:  History of gestational trophoblastic disease, staging for extra uterine disease and persistent disease in the myometrium. History of molar pregnancy in the myometrium ultrasound on 10/09/2020 showing persistence/recurrence gestational trophoblastic disease. EXAM: CT CHEST, ABDOMEN, AND PELVIS WITH CONTRAST TECHNIQUE: Multidetector CT imaging of the chest, abdomen and pelvis was performed following the standard protocol during bolus administration of intravenous contrast. CONTRAST:  75mL OMNIPAQUE IOHEXOL 300 MG/ML  SOLN COMPARISON:  Pelvic ultrasound July 10, 2020 and October 09, 2020, MRI  pelvis October 12, 2020. FINDINGS: CT CHEST FINDINGS Cardiovascular: No significant vascular findings. Normal heart size. No pericardial effusion. Mediastinum/Nodes: No enlarged mediastinal, hilar, or axillary lymph nodes. Thyroid gland, trachea, and esophagus demonstrate no significant findings. Lungs/Pleura: Solid 5 mm nodule in the left lower lobe (series 4, image 98). There are additional scattered tiny pulmonary nodules which measure 2 mm or smaller for instance in the left upper lobe on series 4 image 35, image 42, and 62. No pleural effusion or pneumothorax. Musculoskeletal: No chest wall mass or suspicious bone lesions identified. CT ABDOMEN PELVIS  FINDINGS Hepatobiliary: No focal liver abnormality is seen. No gallstones, gallbladder wall thickening, or biliary dilatation. Pancreas: Unremarkable. No pancreatic ductal dilatation or surrounding inflammatory changes. Spleen: Normal in size without focal abnormality. Adrenals/Urinary Tract: Adrenal glands are unremarkable. Kidneys are normal, without renal calculi, focal lesion, or hydronephrosis. Bladder is unremarkable. Stomach/Bowel: Stomach is within normal limits. Appendix appears normal. No evidence of bowel wall thickening, distention, or inflammatory changes. Vascular/Lymphatic: No significant vascular findings are present. No enlarged abdominal or pelvic lymph nodes. Reproductive: Please refer to same day MRI of the pelvis findings regarding the uterus/gestational trophoblastic disease. Other: No abdominal wall hernia or abnormality. No abdominopelvic ascites. Musculoskeletal: No suspicious lytic or blastic lesions of bone. No acute osseous abnormality IMPRESSION: 1. Solid 5 mm nodule in the left lower lobe, with additional scattered 81mm or smaller pulmonary nodules. Which in the setting of known malignancy are suspicious for metastatic involvement. 2. Please refer to same day MRI of the pelvis for findings regarding uterine gestational trophoblastic disease. 3.  No evidence of metastatic disease in the abdomen or pelvis. Electronically Signed   By: Maudry Mayhew MD   On: 10/12/2020 12:23

## 2020-11-09 NOTE — Assessment & Plan Note (Signed)
I encouraged her to proceed with Covid vaccine booster injection

## 2020-11-10 ENCOUNTER — Telehealth: Payer: Self-pay

## 2020-11-10 ENCOUNTER — Inpatient Hospital Stay: Payer: BC Managed Care – PPO

## 2020-11-10 ENCOUNTER — Other Ambulatory Visit: Payer: Self-pay

## 2020-11-10 VITALS — BP 130/78 | HR 90 | Temp 98.0°F | Resp 18

## 2020-11-10 DIAGNOSIS — O019 Hydatidiform mole, unspecified: Secondary | ICD-10-CM

## 2020-11-10 LAB — BETA HCG QUANT (REF LAB): hCG Quant: 474 m[IU]/mL

## 2020-11-10 MED ORDER — METHOTREXATE SODIUM (PF) CHEMO INJECTION 250 MG/10ML
22.0000 mg | Freq: Once | INTRAMUSCULAR | Status: AC
  Administered 2020-11-10: 22 mg via INTRAMUSCULAR
  Filled 2020-11-10: qty 0.88

## 2020-11-10 NOTE — Telephone Encounter (Signed)
Called Maria [name] translator and given below message. She will call [facility] and give below message. She will contact the office with questions.

## 2020-11-10 NOTE — Telephone Encounter (Signed)
-----   Message from Artis Delay, MD sent at 11/10/2020  9:32 AM EST ----- Regarding: BHCG Pls call Raynelle Fanning and let her know Beta HCG is significantly lower but still detectable Continue Rx

## 2020-11-10 NOTE — Patient Instructions (Signed)
[facility] Discharge Instructions for Patients Receiving Chemotherapy  Today you received the following chemotherapy agents: methotrexate.  To help prevent nausea and vomiting after your treatment, we encourage you to take your nausea medication as directed.   If you develop nausea and vomiting that is not controlled by your nausea medication, call the clinic.   BELOW ARE SYMPTOMS THAT SHOULD BE REPORTED IMMEDIATELY:  *FEVER GREATER THAN 100.5 F  *CHILLS WITH OR WITHOUT FEVER  NAUSEA AND VOMITING THAT IS NOT CONTROLLED WITH YOUR NAUSEA MEDICATION  *UNUSUAL SHORTNESS OF BREATH  *UNUSUAL BRUISING OR BLEEDING  TENDERNESS IN MOUTH AND THROAT WITH OR WITHOUT PRESENCE OF ULCERS  *URINARY PROBLEMS  *BOWEL PROBLEMS  UNUSUAL RASH Items with * indicate a potential emergency and should be followed up as soon as possible.  Feel free to call the clinic should you have any questions or concerns. The clinic phone number is [phone].  Please show the CHEMO ALERT CARD at check-in to the Emergency Department and triage nurse.   

## 2020-11-11 ENCOUNTER — Inpatient Hospital Stay: Payer: BC Managed Care – PPO

## 2020-11-11 ENCOUNTER — Other Ambulatory Visit: Payer: Self-pay

## 2020-11-11 VITALS — BP 124/62 | HR 88 | Temp 98.4°F | Resp 18 | Ht 61.0 in | Wt 130.8 lb

## 2020-11-11 DIAGNOSIS — O019 Hydatidiform mole, unspecified: Secondary | ICD-10-CM

## 2020-11-11 MED ORDER — METHOTREXATE SODIUM (PF) CHEMO INJECTION 250 MG/10ML
22.0000 mg | Freq: Once | INTRAMUSCULAR | Status: AC
  Administered 2020-11-11: 22 mg via INTRAMUSCULAR
  Filled 2020-11-11: qty 0.88

## 2020-11-11 NOTE — Patient Instructions (Signed)
[facility] Discharge Instructions for Patients Receiving Chemotherapy  Today you received the following chemotherapy agents: Methotrexate (PF).  To help prevent nausea and vomiting after your treatment, we encourage you to take your nausea medication as prescribed.  If you develop nausea and vomiting that is not controlled by your nausea medication, call the clinic.   BELOW ARE SYMPTOMS THAT SHOULD BE REPORTED IMMEDIATELY:  *FEVER GREATER THAN 100.5 F  *CHILLS WITH OR WITHOUT FEVER  NAUSEA AND VOMITING THAT IS NOT CONTROLLED WITH YOUR NAUSEA MEDICATION  *UNUSUAL SHORTNESS OF BREATH  *UNUSUAL BRUISING OR BLEEDING  TENDERNESS IN MOUTH AND THROAT WITH OR WITHOUT PRESENCE OF ULCERS  *URINARY PROBLEMS  *BOWEL PROBLEMS  UNUSUAL RASH Items with * indicate a potential emergency and should be followed up as soon as possible.  Feel free to call the clinic should you have any questions or concerns. The clinic phone number is [phone].  Please show the CHEMO ALERT CARD at check-in to the Emergency Department and triage nurse.   

## 2020-11-12 ENCOUNTER — Other Ambulatory Visit: Payer: Self-pay

## 2020-11-12 ENCOUNTER — Inpatient Hospital Stay: Payer: BC Managed Care – PPO

## 2020-11-12 VITALS — BP 139/74 | HR 92 | Temp 98.3°F | Resp 18

## 2020-11-12 DIAGNOSIS — O019 Hydatidiform mole, unspecified: Secondary | ICD-10-CM | POA: Diagnosis not present

## 2020-11-12 MED ORDER — METHOTREXATE SODIUM (PF) CHEMO INJECTION 250 MG/10ML
22.0000 mg | Freq: Once | INTRAMUSCULAR | Status: AC
  Administered 2020-11-12: 22 mg via INTRAMUSCULAR
  Filled 2020-11-12: qty 0.88

## 2020-11-12 NOTE — Patient Instructions (Signed)
[facility] Discharge Instructions for Patients Receiving Chemotherapy  Today you received the following chemotherapy agents: methotrexate.  To help prevent nausea and vomiting after your treatment, we encourage you to take your nausea medication as directed.   If you develop nausea and vomiting that is not controlled by your nausea medication, call the clinic.   BELOW ARE SYMPTOMS THAT SHOULD BE REPORTED IMMEDIATELY:  *FEVER GREATER THAN 100.5 F  *CHILLS WITH OR WITHOUT FEVER  NAUSEA AND VOMITING THAT IS NOT CONTROLLED WITH YOUR NAUSEA MEDICATION  *UNUSUAL SHORTNESS OF BREATH  *UNUSUAL BRUISING OR BLEEDING  TENDERNESS IN MOUTH AND THROAT WITH OR WITHOUT PRESENCE OF ULCERS  *URINARY PROBLEMS  *BOWEL PROBLEMS  UNUSUAL RASH Items with * indicate a potential emergency and should be followed up as soon as possible.  Feel free to call the clinic should you have any questions or concerns. The clinic phone number is [phone].  Please show the CHEMO ALERT CARD at check-in to the Emergency Department and triage nurse.   

## 2020-11-13 ENCOUNTER — Other Ambulatory Visit: Payer: Self-pay

## 2020-11-13 ENCOUNTER — Inpatient Hospital Stay: Payer: BC Managed Care – PPO

## 2020-11-13 VITALS — BP 127/61 | HR 75 | Temp 97.9°F | Resp 18

## 2020-11-13 DIAGNOSIS — O019 Hydatidiform mole, unspecified: Secondary | ICD-10-CM | POA: Diagnosis not present

## 2020-11-13 MED ORDER — METHOTREXATE SODIUM (PF) CHEMO INJECTION 250 MG/10ML
22.0000 mg | Freq: Once | INTRAMUSCULAR | Status: AC
  Administered 2020-11-13: 22 mg via INTRAMUSCULAR
  Filled 2020-11-13: qty 0.88

## 2020-11-13 NOTE — Patient Instructions (Signed)
[facility] Discharge Instructions for Patients Receiving Chemotherapy  Today you received the following chemotherapy agents: Methotrexate (PF).  To help prevent nausea and vomiting after your treatment, we encourage you to take your nausea medication as prescribed.  If you develop nausea and vomiting that is not controlled by your nausea medication, call the clinic.   BELOW ARE SYMPTOMS THAT SHOULD BE REPORTED IMMEDIATELY:  *FEVER GREATER THAN 100.5 F  *CHILLS WITH OR WITHOUT FEVER  NAUSEA AND VOMITING THAT IS NOT CONTROLLED WITH YOUR NAUSEA MEDICATION  *UNUSUAL SHORTNESS OF BREATH  *UNUSUAL BRUISING OR BLEEDING  TENDERNESS IN MOUTH AND THROAT WITH OR WITHOUT PRESENCE OF ULCERS  *URINARY PROBLEMS  *BOWEL PROBLEMS  UNUSUAL RASH Items with * indicate a potential emergency and should be followed up as soon as possible.  Feel free to call the clinic should you have any questions or concerns. The clinic phone number is [phone].  Please show the CHEMO ALERT CARD at check-in to the Emergency Department and triage nurse.   

## 2020-11-23 ENCOUNTER — Inpatient Hospital Stay: Payer: BC Managed Care – PPO

## 2020-11-23 ENCOUNTER — Encounter: Payer: Self-pay | Admitting: Hematology and Oncology

## 2020-11-23 ENCOUNTER — Inpatient Hospital Stay (HOSPITAL_BASED_OUTPATIENT_CLINIC_OR_DEPARTMENT_OTHER): Payer: BC Managed Care – PPO | Admitting: Hematology and Oncology

## 2020-11-23 ENCOUNTER — Other Ambulatory Visit: Payer: Self-pay

## 2020-11-23 VITALS — HR 87

## 2020-11-23 DIAGNOSIS — R030 Elevated blood-pressure reading, without diagnosis of hypertension: Secondary | ICD-10-CM

## 2020-11-23 DIAGNOSIS — O019 Hydatidiform mole, unspecified: Secondary | ICD-10-CM

## 2020-11-23 LAB — CMP (CANCER CENTER ONLY)
ALT: 12 U/L (ref 0–44)
AST: 14 U/L — ABNORMAL LOW (ref 15–41)
Albumin: 4.2 g/dL (ref 3.5–5.0)
Alkaline Phosphatase: 72 U/L (ref 38–126)
Anion gap: 7 (ref 5–15)
BUN: 7 mg/dL (ref 6–20)
CO2: 25 mmol/L (ref 22–32)
Calcium: 9.6 mg/dL (ref 8.9–10.3)
Chloride: 105 mmol/L (ref 98–111)
Creatinine: 0.73 mg/dL (ref 0.44–1.00)
GFR, Estimated: >60 mL/min (ref >60)
Glucose, Bld: 127 mg/dL — ABNORMAL HIGH (ref 70–99)
Potassium: 4 mmol/L (ref 3.5–5.1)
Sodium: 137 mmol/L (ref 135–145)
Total Bilirubin: 0.4 mg/dL (ref 0.3–1.2)
Total Protein: 8 g/dL (ref 6.5–8.1)

## 2020-11-23 LAB — CBC WITH DIFFERENTIAL (CANCER CENTER ONLY)
Abs Immature Granulocytes: 0.01 10*3/uL (ref 0.00–0.07)
Basophils Absolute: 0.1 10*3/uL (ref 0.0–0.1)
Basophils Relative: 1 %
Eosinophils Absolute: 0.1 10*3/uL (ref 0.0–0.5)
Eosinophils Relative: 1 %
HCT: 39.8 % (ref 36.0–46.0)
Hemoglobin: 13.3 g/dL (ref 12.0–15.0)
Immature Granulocytes: 0 %
Lymphocytes Relative: 25 %
Lymphs Abs: 1.9 10*3/uL (ref 0.7–4.0)
MCH: 29.9 pg (ref 26.0–34.0)
MCHC: 33.4 g/dL (ref 30.0–36.0)
MCV: 89.4 fL (ref 80.0–100.0)
Monocytes Absolute: 0.6 10*3/uL (ref 0.1–1.0)
Monocytes Relative: 7 %
Neutro Abs: 5 10*3/uL (ref 1.7–7.7)
Neutrophils Relative %: 66 %
Platelet Count: 360 10*3/uL (ref 150–400)
RBC: 4.45 MIL/uL (ref 3.87–5.11)
RDW: 15.1 % (ref 11.5–15.5)
WBC Count: 7.7 10*3/uL (ref 4.0–10.5)
nRBC: 0 % (ref 0.0–0.2)

## 2020-11-23 MED ORDER — METHOTREXATE SODIUM (PF) CHEMO INJECTION 250 MG/10ML
22.0000 mg | Freq: Once | INTRAMUSCULAR | Status: AC
  Administered 2020-11-23: 22 mg via INTRAMUSCULAR
  Filled 2020-11-23: qty 0.88

## 2020-11-23 NOTE — Patient Instructions (Signed)
[facility] Discharge Instructions for Patients Receiving Chemotherapy  Today you received the following chemotherapy agents: methotrexate.  To help prevent nausea and vomiting after your treatment, we encourage you to take your nausea medication as directed.   If you develop nausea and vomiting that is not controlled by your nausea medication, call the clinic.   BELOW ARE SYMPTOMS THAT SHOULD BE REPORTED IMMEDIATELY:  *FEVER GREATER THAN 100.5 F  *CHILLS WITH OR WITHOUT FEVER  NAUSEA AND VOMITING THAT IS NOT CONTROLLED WITH YOUR NAUSEA MEDICATION  *UNUSUAL SHORTNESS OF BREATH  *UNUSUAL BRUISING OR BLEEDING  TENDERNESS IN MOUTH AND THROAT WITH OR WITHOUT PRESENCE OF ULCERS  *URINARY PROBLEMS  *BOWEL PROBLEMS  UNUSUAL RASH Items with * indicate a potential emergency and should be followed up as soon as possible.  Feel free to call the clinic should you have any questions or concerns. The clinic phone number is [phone].  Please show the CHEMO ALERT CARD at check-in to the Emergency Department and triage nurse.

## 2020-11-23 NOTE — Progress Notes (Signed)
[facility] OFFICE PROGRESS NOTE  Patient Care Team: Patient, No Pcp Per as PCP - General (General Practice) Paulina Fusi, Servando Snare, RN as Oncology Nurse Navigator (Oncology)  ASSESSMENT & PLAN:  Gestational trophoblastic neoplasm So far, she tolerated treatment very well without major side effects  Due to her social circumstances, I will try to minimize waiting time for treatment She is instructed to take premedication Compazine an hour before each chemo appointment She is instructed to take daily folic acid to avoid risk of pancytopenia She will get tumor marker monitoring every other week Once she has achieved normalization of hCG, we will go 3 more cycles beyond before stopping I will call her with results of her hCG tomorrow After that, I plan to order MRI of the pelvis and refer her back to GYN oncologist for further evaluation She is in agreement to proceed with the plan of care   Elevated BP without diagnosis of hypertension This could be due to anxiety Observe for now   No orders of the defined types were placed in this encounter.   All questions were answered. The patient knows to call the clinic with any problems, questions or concerns. The total time spent in the appointment was 20 minutes encounter with patients including review of chart and various tests results, discussions about plan of care and coordination of care plan   Artis Delay, MD 11/23/2020 3:25 PM  INTERVAL HISTORY: Please see below for problem oriented charting. She returns for further follow-up She is doing well Denies significant recent nausea She has no recent menstruation She continues taking birth control pills to prevent pregnancy  SUMMARY OF ONCOLOGIC HISTORY: Maria [name] 30 y.o. 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 Summary of oncologic history is as follows: The patient probably has missed miscarriage late last year She has 1 son born in 2017 The patient is otherwise healthy She is currently not working Her husband, Maria [name], works night shift between 11 PM to 7:30 AM, with sporadic schedule, in general, 6 days block each time  On July 10, 2020, she underwent ultrasound of the pelvis which showed  Complex very thickened  endometrium consistent with possible molar pregnancy, definitely a non viable pregnancy [id number] Estimated Date of Delivery: None noted.  Normal general sonographic findings  Her gynecologist performed suction and sharp uterine evacuation, pathology showed FINAL MICROSCOPIC DIAGNOSIS:   A. PRODUCTS OF CONCEPTION, DILATATION AND EVACUATION:  - Chorionic villi with features consistent with molar gestation.  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 Solid 5 mm nodule in the left lower lobe, with additional scattered 62mm or smaller pulmonary nodules. Which in the setting of known malignancy are suspicious  for metastatic involvement.  2. Please refer to same day MRI of the pelvis for findings regarding uterine gestational trophoblastic  disease.  3.  No evidence of metastatic disease in the abdomen or pelvis.  MRI of the pelvis on October 12, 2020 showed  2.4 cm ill-defined hyperenhancing lesion in the right posterior fundal myometrium, suspicious for invasive mole.  No evidence of extra-uterine metastatic disease.  The patient is not symptomatic She denies pelvic pain or excessive vaginal bleeding Her appetite is normal She desire further pregnancy MRI brain performed on October 16, 2020 showed no evidence of disease On October 26, 2020, she will proceed with cycle 1 of methotrexate  REVIEW OF SYSTEMS:   Constitutional: Denies fevers, chills or abnormal weight loss Eyes: Denies blurriness of vision Ears, nose, mouth, throat, and face: Denies mucositis or sore throat Respiratory: Denies cough, dyspnea or wheezes Cardiovascular: Denies palpitation, chest discomfort or lower extremity swelling Gastrointestinal:  Denies nausea, heartburn or change in bowel habits Skin: Denies abnormal skin rashes Lymphatics: Denies new lymphadenopathy or easy bruising Neurological:Denies numbness, tingling or new weaknesses Behavioral/Psych: Mood is stable, no new changes  All other systems were reviewed with the patient and are negative.  I have reviewed the past medical history, past surgical history, social history and family history with the patient and they are unchanged from previous note.  ALLERGIES:  has No Known Allergies.  MEDICATIONS:  Current Outpatient Medications  Medication Sig Dispense Refill  . desogestrel-ethinyl estradiol (APRI) 0.15-30 MG-MCG tablet Take 1 tablet by mouth daily. 28 tablet 11  . Ferrous Sulfate (IRON PO) Take 1 tablet by mouth daily.    . folic acid (FOLVITE) 1 MG tablet Take 1 tablet (1 mg total) by mouth daily. 30 tablet 11  . Multiple Vitamin (MULTIVITAMIN PO) Take 1 tablet by mouth daily.    . ondansetron (ZOFRAN) 8 MG tablet Take 1 tablet (8 mg total) by mouth every 8 (eight) hours as  needed (Nausea or vomiting). 30 tablet 1  . prochlorperazine (COMPAZINE) 10 MG tablet Take 1 tablet (10 mg total) by mouth every 6 (six) hours as needed (Nausea or vomiting). 30 tablet 1   No current facility-administered medications for this visit.   Facility-Administered Medications Ordered in Other Visits  Medication Dose Route Frequency Provider Last Rate Last Admin  . methotrexate (PF) chemo injection 22 mg  22 mg Intramuscular Once Bertis Ruddy, Merrell Rettinger, MD        PHYSICAL EXAMINATION: ECOG PERFORMANCE STATUS: 0 - Asymptomatic  Vitals:   11/23/20 1445  BP: (!) 147/66  Pulse: (!) 110  Resp: 18  Temp: (!) 97.5 F (36.4 C)  SpO2: 100%   Filed Weights   11/23/20 1445  Weight: 132 lb 12.8 oz (60.2 kg)    GENERAL:alert, no distress and comfortable SKIN: skin color, texture, turgor are normal, no rashes or significant lesions EYES: normal, Conjunctiva are pink and non-injected, sclera clear OROPHARYNX:no exudate, no erythema and lips, buccal mucosa, and tongue normal  NECK: supple, thyroid normal size, non-tender, without nodularity LYMPH:  no palpable lymphadenopathy in the cervical, axillary or inguinal LUNGS: clear to auscultation and percussion with normal breathing effort HEART: regular rate & rhythm and no murmurs and no lower extremity edema ABDOMEN:abdomen soft, non-tender and normal bowel sounds Musculoskeletal:no cyanosis of digits and no clubbing  NEURO: alert & oriented x 3 with fluent speech, no focal motor/sensory deficits  LABORATORY DATA:  I have reviewed the data as listed    Component Value Date/Time  NA 137 11/23/2020 1428   K 4.0 11/23/2020 1428   CL 105 11/23/2020 1428   CO2 25 11/23/2020 1428   GLUCOSE 127 (H) 11/23/2020 1428   BUN 7 11/23/2020 1428   CREATININE 0.73 11/23/2020 1428   CREATININE 0.60 08/20/2014 1505   CALCIUM 9.6 11/23/2020 1428   PROT 8.0 11/23/2020 1428   ALBUMIN 4.2 11/23/2020 1428   AST 14 (L) 11/23/2020 1428   ALT 12 11/23/2020  1428   ALKPHOS 72 11/23/2020 1428   BILITOT 0.4 11/23/2020 1428   GFRNONAA >60 11/23/2020 1428    No results found for: SPEP, UPEP  Lab Results  Component Value Date   WBC 7.7 11/23/2020   NEUTROABS 5.0 11/23/2020   HGB 13.3 11/23/2020   HCT 39.8 11/23/2020   MCV 89.4 11/23/2020   PLT 360 11/23/2020      Chemistry      Component Value Date/Time   NA 137 11/23/2020 1428   K 4.0 11/23/2020 1428   CL 105 11/23/2020 1428   CO2 25 11/23/2020 1428   BUN 7 11/23/2020 1428   CREATININE 0.73 11/23/2020 1428   CREATININE 0.60 08/20/2014 1505      Component Value Date/Time   CALCIUM 9.6 11/23/2020 1428   ALKPHOS 72 11/23/2020 1428   AST 14 (L) 11/23/2020 1428   ALT 12 11/23/2020 1428   BILITOT 0.4 11/23/2020 1428

## 2020-11-23 NOTE — Assessment & Plan Note (Signed)
So far, she tolerated treatment very well without major side effects  Due to her social circumstances, I will try to minimize waiting time for treatment She is instructed to take premedication Compazine an hour before each chemo appointment She is instructed to take daily folic acid to avoid risk of pancytopenia She will get tumor marker monitoring every other week Once she has achieved normalization of hCG, we will go 3 more cycles beyond before stopping I will call her with results of her hCG tomorrow After that, I plan to order MRI of the pelvis and refer her back to GYN oncologist for further evaluation She is in agreement to proceed with the plan of care

## 2020-11-23 NOTE — Assessment & Plan Note (Signed)
This could be due to anxiety Observe for now 

## 2020-11-24 ENCOUNTER — Inpatient Hospital Stay: Payer: BC Managed Care – PPO | Attending: Gynecologic Oncology

## 2020-11-24 ENCOUNTER — Other Ambulatory Visit: Payer: Self-pay

## 2020-11-24 ENCOUNTER — Telehealth: Payer: Self-pay

## 2020-11-24 VITALS — BP 130/65 | HR 80 | Temp 98.4°F | Resp 18

## 2020-11-24 DIAGNOSIS — R030 Elevated blood-pressure reading, without diagnosis of hypertension: Secondary | ICD-10-CM | POA: Diagnosis not present

## 2020-11-24 DIAGNOSIS — O019 Hydatidiform mole, unspecified: Secondary | ICD-10-CM | POA: Diagnosis not present

## 2020-11-24 DIAGNOSIS — Z793 Long term (current) use of hormonal contraceptives: Secondary | ICD-10-CM | POA: Diagnosis not present

## 2020-11-24 DIAGNOSIS — R918 Other nonspecific abnormal finding of lung field: Secondary | ICD-10-CM | POA: Insufficient documentation

## 2020-11-24 DIAGNOSIS — R5383 Other fatigue: Secondary | ICD-10-CM | POA: Diagnosis not present

## 2020-11-24 LAB — BETA HCG QUANT (REF LAB): hCG Quant: 11 m[IU]/mL

## 2020-11-24 MED ORDER — METHOTREXATE SODIUM (PF) CHEMO INJECTION 250 MG/10ML
22.0000 mg | Freq: Once | INTRAMUSCULAR | Status: AC
  Administered 2020-11-24: 22 mg via INTRAMUSCULAR
  Filled 2020-11-24: qty 0.88

## 2020-11-24 NOTE — Progress Notes (Signed)
Pt. states she took compazine and Folic Acid at home.

## 2020-11-24 NOTE — Patient Instructions (Signed)
[facility] Discharge Instructions for Patients Receiving Chemotherapy  Today you received the following chemotherapy agents Methotrexate  To help prevent nausea and vomiting after your treatment, we encourage you to take your nausea medication as directed.   If you develop nausea and vomiting that is not controlled by your nausea medication, call the clinic.   BELOW ARE SYMPTOMS THAT SHOULD BE REPORTED IMMEDIATELY:  *FEVER GREATER THAN 100.5 F  *CHILLS WITH OR WITHOUT FEVER  NAUSEA AND VOMITING THAT IS NOT CONTROLLED WITH YOUR NAUSEA MEDICATION  *UNUSUAL SHORTNESS OF BREATH  *UNUSUAL BRUISING OR BLEEDING  TENDERNESS IN MOUTH AND THROAT WITH OR WITHOUT PRESENCE OF ULCERS  *URINARY PROBLEMS  *BOWEL PROBLEMS  UNUSUAL RASH Items with * indicate a potential emergency and should be followed up as soon as possible.  Feel free to call the clinic should you have any questions or concerns. The clinic phone number is [phone].  Please show the CHEMO ALERT CARD at check-in to the Emergency Department and triage nurse.   

## 2020-11-24 NOTE — Progress Notes (Signed)
Pt states she took her Compazine at home before coming to [facility]

## 2020-11-24 NOTE — Telephone Encounter (Signed)
Infusion nurse aware of B-HCG results and will rely to patient during appointment today.

## 2020-11-24 NOTE — Telephone Encounter (Signed)
-----   Message from Artis Delay, MD sent at 11/24/2020  8:19 AM EST ----- Regarding: tell Raynelle Fanning or give pt results today She comes back today for chemo late afternoon Let her know B-HCG is at 75 She speaks reasonable [place]

## 2020-11-25 ENCOUNTER — Inpatient Hospital Stay: Payer: BC Managed Care – PPO

## 2020-11-25 ENCOUNTER — Other Ambulatory Visit: Payer: Self-pay

## 2020-11-25 VITALS — BP 131/76 | HR 85 | Temp 98.5°F | Resp 18

## 2020-11-25 DIAGNOSIS — R030 Elevated blood-pressure reading, without diagnosis of hypertension: Secondary | ICD-10-CM | POA: Diagnosis not present

## 2020-11-25 DIAGNOSIS — Z793 Long term (current) use of hormonal contraceptives: Secondary | ICD-10-CM | POA: Diagnosis not present

## 2020-11-25 DIAGNOSIS — R918 Other nonspecific abnormal finding of lung field: Secondary | ICD-10-CM | POA: Diagnosis not present

## 2020-11-25 DIAGNOSIS — O019 Hydatidiform mole, unspecified: Secondary | ICD-10-CM

## 2020-11-25 DIAGNOSIS — R5383 Other fatigue: Secondary | ICD-10-CM | POA: Diagnosis not present

## 2020-11-25 MED ORDER — METHOTREXATE SODIUM (PF) CHEMO INJECTION 250 MG/10ML
22.0000 mg | Freq: Once | INTRAMUSCULAR | Status: AC
  Administered 2020-11-25: 22 mg via INTRAMUSCULAR
  Filled 2020-11-25: qty 0.88

## 2020-11-25 NOTE — Progress Notes (Signed)
Pt states she took Compazine and folic acid at home today

## 2020-11-25 NOTE — Patient Instructions (Signed)
[facility] Discharge Instructions for Patients Receiving Chemotherapy  Today you received the following chemotherapy agents Methotrexate  To help prevent nausea and vomiting after your treatment, we encourage you to take your nausea medication as directed.   If you develop nausea and vomiting that is not controlled by your nausea medication, call the clinic.   BELOW ARE SYMPTOMS THAT SHOULD BE REPORTED IMMEDIATELY:  *FEVER GREATER THAN 100.5 F  *CHILLS WITH OR WITHOUT FEVER  NAUSEA AND VOMITING THAT IS NOT CONTROLLED WITH YOUR NAUSEA MEDICATION  *UNUSUAL SHORTNESS OF BREATH  *UNUSUAL BRUISING OR BLEEDING  TENDERNESS IN MOUTH AND THROAT WITH OR WITHOUT PRESENCE OF ULCERS  *URINARY PROBLEMS  *BOWEL PROBLEMS  UNUSUAL RASH Items with * indicate a potential emergency and should be followed up as soon as possible.  Feel free to call the clinic should you have any questions or concerns. The clinic phone number is [phone].  Please show the CHEMO ALERT CARD at check-in to the Emergency Department and triage nurse.   

## 2020-11-26 ENCOUNTER — Inpatient Hospital Stay: Payer: BC Managed Care – PPO

## 2020-11-26 ENCOUNTER — Other Ambulatory Visit: Payer: Self-pay

## 2020-11-26 VITALS — BP 120/68 | HR 84 | Temp 98.9°F | Resp 16

## 2020-11-26 DIAGNOSIS — R5383 Other fatigue: Secondary | ICD-10-CM | POA: Diagnosis not present

## 2020-11-26 DIAGNOSIS — O019 Hydatidiform mole, unspecified: Secondary | ICD-10-CM

## 2020-11-26 DIAGNOSIS — R030 Elevated blood-pressure reading, without diagnosis of hypertension: Secondary | ICD-10-CM | POA: Diagnosis not present

## 2020-11-26 DIAGNOSIS — Z793 Long term (current) use of hormonal contraceptives: Secondary | ICD-10-CM | POA: Diagnosis not present

## 2020-11-26 DIAGNOSIS — R918 Other nonspecific abnormal finding of lung field: Secondary | ICD-10-CM | POA: Diagnosis not present

## 2020-11-26 MED ORDER — METHOTREXATE SODIUM (PF) CHEMO INJECTION 250 MG/10ML
22.0000 mg | Freq: Once | INTRAMUSCULAR | Status: AC
  Administered 2020-11-26: 22 mg via INTRAMUSCULAR
  Filled 2020-11-26: qty 0.88

## 2020-11-26 NOTE — Patient Instructions (Signed)
[facility] Discharge Instructions for Patients Receiving Chemotherapy  Today you received the following chemotherapy agents: methotrexate.  To help prevent nausea and vomiting after your treatment, we encourage you to take your nausea medication as directed.   If you develop nausea and vomiting that is not controlled by your nausea medication, call the clinic.   BELOW ARE SYMPTOMS THAT SHOULD BE REPORTED IMMEDIATELY:  *FEVER GREATER THAN 100.5 F  *CHILLS WITH OR WITHOUT FEVER  NAUSEA AND VOMITING THAT IS NOT CONTROLLED WITH YOUR NAUSEA MEDICATION  *UNUSUAL SHORTNESS OF BREATH  *UNUSUAL BRUISING OR BLEEDING  TENDERNESS IN MOUTH AND THROAT WITH OR WITHOUT PRESENCE OF ULCERS  *URINARY PROBLEMS  *BOWEL PROBLEMS  UNUSUAL RASH Items with * indicate a potential emergency and should be followed up as soon as possible.  Feel free to call the clinic should you have any questions or concerns. The clinic phone number is [phone].  Please show the CHEMO ALERT CARD at check-in to the Emergency Department and triage nurse.   

## 2020-11-27 ENCOUNTER — Other Ambulatory Visit: Payer: Self-pay

## 2020-11-27 ENCOUNTER — Inpatient Hospital Stay: Payer: BC Managed Care – PPO

## 2020-11-27 VITALS — BP 134/68 | HR 91 | Temp 98.1°F | Resp 16 | Wt 132.2 lb

## 2020-11-27 DIAGNOSIS — O019 Hydatidiform mole, unspecified: Secondary | ICD-10-CM

## 2020-11-27 DIAGNOSIS — Z793 Long term (current) use of hormonal contraceptives: Secondary | ICD-10-CM | POA: Diagnosis not present

## 2020-11-27 DIAGNOSIS — R5383 Other fatigue: Secondary | ICD-10-CM | POA: Diagnosis not present

## 2020-11-27 DIAGNOSIS — R030 Elevated blood-pressure reading, without diagnosis of hypertension: Secondary | ICD-10-CM | POA: Diagnosis not present

## 2020-11-27 DIAGNOSIS — R918 Other nonspecific abnormal finding of lung field: Secondary | ICD-10-CM | POA: Diagnosis not present

## 2020-11-27 MED ORDER — METHOTREXATE SODIUM (PF) CHEMO INJECTION 250 MG/10ML
22.0000 mg | Freq: Once | INTRAMUSCULAR | Status: AC
  Administered 2020-11-27: 22 mg via INTRAMUSCULAR
  Filled 2020-11-27: qty 0.88

## 2020-11-27 NOTE — Patient Instructions (Signed)
[facility] Discharge Instructions for Patients Receiving Chemotherapy  Today you received the following chemotherapy agents: methotrexate.  To help prevent nausea and vomiting after your treatment, we encourage you to take your nausea medication as directed.   If you develop nausea and vomiting that is not controlled by your nausea medication, call the clinic.   BELOW ARE SYMPTOMS THAT SHOULD BE REPORTED IMMEDIATELY:  *FEVER GREATER THAN 100.5 F  *CHILLS WITH OR WITHOUT FEVER  NAUSEA AND VOMITING THAT IS NOT CONTROLLED WITH YOUR NAUSEA MEDICATION  *UNUSUAL SHORTNESS OF BREATH  *UNUSUAL BRUISING OR BLEEDING  TENDERNESS IN MOUTH AND THROAT WITH OR WITHOUT PRESENCE OF ULCERS  *URINARY PROBLEMS  *BOWEL PROBLEMS  UNUSUAL RASH Items with * indicate a potential emergency and should be followed up as soon as possible.  Feel free to call the clinic should you have any questions or concerns. The clinic phone number is [phone].  Please show the CHEMO ALERT CARD at check-in to the Emergency Department and triage nurse.   

## 2020-12-07 ENCOUNTER — Encounter: Payer: Self-pay | Admitting: Hematology and Oncology

## 2020-12-07 ENCOUNTER — Inpatient Hospital Stay: Payer: BC Managed Care – PPO

## 2020-12-07 ENCOUNTER — Inpatient Hospital Stay (HOSPITAL_BASED_OUTPATIENT_CLINIC_OR_DEPARTMENT_OTHER): Payer: BC Managed Care – PPO | Admitting: Hematology and Oncology

## 2020-12-07 ENCOUNTER — Other Ambulatory Visit: Payer: Self-pay

## 2020-12-07 ENCOUNTER — Telehealth: Payer: Self-pay | Admitting: Hematology and Oncology

## 2020-12-07 VITALS — HR 123

## 2020-12-07 DIAGNOSIS — O019 Hydatidiform mole, unspecified: Secondary | ICD-10-CM

## 2020-12-07 DIAGNOSIS — R5383 Other fatigue: Secondary | ICD-10-CM | POA: Diagnosis not present

## 2020-12-07 DIAGNOSIS — R918 Other nonspecific abnormal finding of lung field: Secondary | ICD-10-CM | POA: Diagnosis not present

## 2020-12-07 DIAGNOSIS — R030 Elevated blood-pressure reading, without diagnosis of hypertension: Secondary | ICD-10-CM | POA: Diagnosis not present

## 2020-12-07 DIAGNOSIS — Z793 Long term (current) use of hormonal contraceptives: Secondary | ICD-10-CM | POA: Diagnosis not present

## 2020-12-07 LAB — CBC WITH DIFFERENTIAL (CANCER CENTER ONLY)
Abs Immature Granulocytes: 0.01 10*3/uL (ref 0.00–0.07)
Basophils Absolute: 0.1 10*3/uL (ref 0.0–0.1)
Basophils Relative: 1 %
Eosinophils Absolute: 0.1 10*3/uL (ref 0.0–0.5)
Eosinophils Relative: 1 %
HCT: 41.8 % (ref 36.0–46.0)
Hemoglobin: 14 g/dL (ref 12.0–15.0)
Immature Granulocytes: 0 %
Lymphocytes Relative: 26 %
Lymphs Abs: 2 10*3/uL (ref 0.7–4.0)
MCH: 30.5 pg (ref 26.0–34.0)
MCHC: 33.5 g/dL (ref 30.0–36.0)
MCV: 91.1 fL (ref 80.0–100.0)
Monocytes Absolute: 0.5 10*3/uL (ref 0.1–1.0)
Monocytes Relative: 6 %
Neutro Abs: 5.1 10*3/uL (ref 1.7–7.7)
Neutrophils Relative %: 66 %
Platelet Count: 366 10*3/uL (ref 150–400)
RBC: 4.59 MIL/uL (ref 3.87–5.11)
RDW: 15.6 % — ABNORMAL HIGH (ref 11.5–15.5)
WBC Count: 7.8 10*3/uL (ref 4.0–10.5)
nRBC: 0 % (ref 0.0–0.2)

## 2020-12-07 LAB — CMP (CANCER CENTER ONLY)
ALT: 18 U/L (ref 0–44)
AST: 17 U/L (ref 15–41)
Albumin: 4.4 g/dL (ref 3.5–5.0)
Alkaline Phosphatase: 81 U/L (ref 38–126)
Anion gap: 8 (ref 5–15)
BUN: 8 mg/dL (ref 6–20)
CO2: 26 mmol/L (ref 22–32)
Calcium: 9.4 mg/dL (ref 8.9–10.3)
Chloride: 106 mmol/L (ref 98–111)
Creatinine: 0.73 mg/dL (ref 0.44–1.00)
GFR, Estimated: >60 mL/min (ref >60)
Glucose, Bld: 122 mg/dL — ABNORMAL HIGH (ref 70–99)
Potassium: 3.4 mmol/L — ABNORMAL LOW (ref 3.5–5.1)
Sodium: 140 mmol/L (ref 135–145)
Total Bilirubin: 0.4 mg/dL (ref 0.3–1.2)
Total Protein: 8.2 g/dL — ABNORMAL HIGH (ref 6.5–8.1)

## 2020-12-07 MED ORDER — METHOTREXATE SODIUM (PF) CHEMO INJECTION 250 MG/10ML
22.0000 mg | Freq: Once | INTRAMUSCULAR | Status: AC
  Administered 2020-12-07: 22 mg via INTRAMUSCULAR
  Filled 2020-12-07: qty 0.88

## 2020-12-07 NOTE — Progress Notes (Signed)
[facility] OFFICE PROGRESS NOTE  Patient Care Team: Patient, No Pcp Per as PCP - General (General Practice) Maria [name], Maria Snare, RN as Oncology Nurse Navigator (Oncology)  ASSESSMENT & PLAN:  Gestational trophoblastic neoplasm So far, she tolerated treatment very well without major side effects  Due to her social circumstances, I will try to minimize waiting time for treatment She is instructed to take premedication Compazine an hour before each chemo appointment She is instructed to take daily folic acid to avoid risk of pancytopenia She will get tumor marker monitoring every other week Once she has achieved normalization of hCG, we will go 3 more cycles beyond before stopping I will call her with results of her hCG tomorrow After that, I plan to order MRI of the pelvis and refer her back to GYN oncologist for further evaluation She is in agreement to proceed with the plan of care   Other fatigue Her labs are unremarkable She is not anemic I will check TSH in the next   Orders Placed This Encounter  Procedures  . TSH    Standing Status:   Future    Standing Expiration Date:   12/07/2021    All questions were answered. The patient knows to call the clinic with any problems, questions or concerns. The total time spent in the appointment was 20 minutes encounter with patients including review of chart and various tests results, discussions about plan of care and coordination of care plan   Maria Delay, MD 12/07/2020 6:38 PM  INTERVAL HISTORY: Please see below for problem oriented charting. She returns with her husband for further follow-up.  Spanish interpreter is also present She tolerated treatment well except for excessive fatigue No recent nausea or changes in bowel habits I verify she is taking all her prescribed medications including folic acid as well as a birth control pill  SUMMARY OF ONCOLOGIC HISTORY: Maria [name] 30 y.o. [name] is here because of  gestational trophoblastic disease She understand a little bit of [place] Spanish interpreter is present throughout the visit Her husband is present throughout the visit as well  I have reviewed her chart and materials related to her cancer extensively and collaborated history with the patient. Summary of oncologic history is as follows: The patient probably has missed miscarriage late last year She has 1 son born in 2017 The patient is otherwise healthy She is currently not working Her husband, Maria [name], works night shift between 11 PM to 7:30 AM, with sporadic schedule, in general, 6 days block each time  On July 10, 2020, she underwent ultrasound of the pelvis which showed  Complex very thickened  endometrium consistent with possible molar pregnancy, definitely a non viable pregnancy G3P1001 Estimated Date of Delivery: None noted.  Normal general sonographic findings  Her gynecologist performed suction and sharp uterine evacuation, pathology showed FINAL MICROSCOPIC DIAGNOSIS:   A. PRODUCTS OF CONCEPTION, DILATATION AND EVACUATION:  - Chorionic villi with features consistent with molar gestation.  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 Solid 5 mm nodule in the left lower lobe, with additional scattered 50mm or smaller pulmonary nodules. Which in the setting  of known malignancy are suspicious for metastatic involvement.  2. Please refer to same day MRI of the pelvis for findings regarding uterine gestational trophoblastic disease.  3.  No evidence of metastatic disease in the abdomen or pelvis.  MRI of the pelvis on October 12, 2020 showed  2.4 cm ill-defined hyperenhancing lesion in the right posterior fundal myometrium, suspicious for invasive mole.  No evidence of extra-uterine metastatic disease.  The patient is not symptomatic She denies pelvic pain or excessive vaginal bleeding Her appetite is normal She desire further pregnancy MRI brain performed on October 16, 2020 showed no evidence of disease On October 26, 2020, she will proceed with cycle 1 of methotrexate  REVIEW OF SYSTEMS:   Constitutional: Denies fevers, chills or abnormal weight loss Eyes: Denies blurriness of vision Ears, nose, mouth, throat, and face: Denies mucositis or sore throat Respiratory: Denies cough, dyspnea or wheezes Cardiovascular: Denies palpitation, chest discomfort or lower extremity swelling Gastrointestinal:  Denies nausea, heartburn or change in bowel habits Skin: Denies abnormal skin rashes Lymphatics: Denies new lymphadenopathy or easy bruising Neurological:Denies numbness, tingling or new weaknesses Behavioral/Psych: Mood is stable, no new changes  All other systems were reviewed with the patient and are negative.  I have reviewed the past medical history, past surgical history, social history and family history with the patient and they are unchanged from previous note.  ALLERGIES:  has No Known Allergies.  MEDICATIONS:  Current Outpatient Medications  Medication Sig Dispense Refill  . desogestrel-ethinyl estradiol (APRI) 0.15-30 MG-MCG tablet Take 1 tablet by mouth daily. 28 tablet 11  . Ferrous Sulfate (IRON PO) Take 1 tablet by mouth daily.    . folic acid (FOLVITE) 1 MG tablet Take 1 tablet (1 mg total) by mouth daily. 30 tablet 11   . Multiple Vitamin (MULTIVITAMIN PO) Take 1 tablet by mouth daily.    . ondansetron (ZOFRAN) 8 MG tablet Take 1 tablet (8 mg total) by mouth every 8 (eight) hours as needed (Nausea or vomiting). 30 tablet 1  . prochlorperazine (COMPAZINE) 10 MG tablet Take 1 tablet (10 mg total) by mouth every 6 (six) hours as needed (Nausea or vomiting). 30 tablet 1   No current facility-administered medications for this visit.    PHYSICAL EXAMINATION: ECOG PERFORMANCE STATUS: 1 - Symptomatic but completely ambulatory  Vitals:   12/07/20 1503  BP: 130/81  Pulse: (!) 142  Resp: 18  Temp: 98.1 F (36.7 C)  SpO2: 100%   Filed Weights   12/07/20 1503  Weight: 131 lb 3.2 oz (59.5 kg)    GENERAL:alert, no distress and comfortable NEURO: alert & oriented x 3 with fluent speech, no focal motor/sensory deficits  LABORATORY DATA:  I have reviewed the data as listed    Component Value Date/Time   NA 140 12/07/2020 1436   K 3.4 (L) 12/07/2020 1436   CL 106 12/07/2020 1436   CO2 26 12/07/2020 1436   GLUCOSE 122 (H) 12/07/2020 1436   BUN 8 12/07/2020 1436   CREATININE 0.73 12/07/2020 1436   CREATININE 0.60 08/20/2014 1505   CALCIUM 9.4 12/07/2020 1436   PROT 8.2 (H) 12/07/2020 1436   ALBUMIN 4.4 12/07/2020 1436   AST 17 12/07/2020 1436   ALT 18 12/07/2020 1436   ALKPHOS 81 12/07/2020 1436  BILITOT 0.4 12/07/2020 1436   GFRNONAA >60 12/07/2020 1436    No results found for: SPEP, UPEP  Lab Results  Component Value Date   WBC 7.8 12/07/2020   NEUTROABS 5.1 12/07/2020   HGB 14.0 12/07/2020   HCT 41.8 12/07/2020   MCV 91.1 12/07/2020   PLT 366 12/07/2020      Chemistry      Component Value Date/Time   NA 140 12/07/2020 1436   K 3.4 (L) 12/07/2020 1436   CL 106 12/07/2020 1436   CO2 26 12/07/2020 1436   BUN 8 12/07/2020 1436   CREATININE 0.73 12/07/2020 1436   CREATININE 0.60 08/20/2014 1505      Component Value Date/Time   CALCIUM 9.4 12/07/2020 1436   ALKPHOS 81 12/07/2020  1436   AST 17 12/07/2020 1436   ALT 18 12/07/2020 1436   BILITOT 0.4 12/07/2020 1436

## 2020-12-07 NOTE — Assessment & Plan Note (Signed)
So far, she tolerated treatment very well without major side effects  Due to her social circumstances, I will try to minimize waiting time for treatment She is instructed to take premedication Compazine an hour before each chemo appointment She is instructed to take daily folic acid to avoid risk of pancytopenia She will get tumor marker monitoring every other week Once she has achieved normalization of hCG, we will go 3 more cycles beyond before stopping I will call her with results of her hCG tomorrow After that, I plan to order MRI of the pelvis and refer her back to GYN oncologist for further evaluation She is in agreement to proceed with the plan of care

## 2020-12-07 NOTE — Telephone Encounter (Signed)
Scheduled appointments per 3/14 sch msg. Patient aware. Gave patient calendar print out.

## 2020-12-07 NOTE — Progress Notes (Signed)
Okay to treat with heart rate of 123, per Dr. Bertis Ruddy.

## 2020-12-07 NOTE — Patient Instructions (Signed)
[facility] Discharge Instructions for Patients Receiving Chemotherapy  Today you received the following chemotherapy agents: methotrexate.  To help prevent nausea and vomiting after your treatment, we encourage you to take your nausea medication as directed.   If you develop nausea and vomiting that is not controlled by your nausea medication, call the clinic.   BELOW ARE SYMPTOMS THAT SHOULD BE REPORTED IMMEDIATELY:  *FEVER GREATER THAN 100.5 F  *CHILLS WITH OR WITHOUT FEVER  NAUSEA AND VOMITING THAT IS NOT CONTROLLED WITH YOUR NAUSEA MEDICATION  *UNUSUAL SHORTNESS OF BREATH  *UNUSUAL BRUISING OR BLEEDING  TENDERNESS IN MOUTH AND THROAT WITH OR WITHOUT PRESENCE OF ULCERS  *URINARY PROBLEMS  *BOWEL PROBLEMS  UNUSUAL RASH Items with * indicate a potential emergency and should be followed up as soon as possible.  Feel free to call the clinic should you have any questions or concerns. The clinic phone number is [phone].  Please show the CHEMO ALERT CARD at check-in to the Emergency Department and triage nurse.   

## 2020-12-07 NOTE — Assessment & Plan Note (Signed)
Her labs are unremarkable She is not anemic I will check TSH in the next

## 2020-12-08 ENCOUNTER — Inpatient Hospital Stay: Payer: BC Managed Care – PPO

## 2020-12-08 ENCOUNTER — Telehealth: Payer: Self-pay

## 2020-12-08 VITALS — BP 131/68 | HR 88 | Temp 99.3°F | Resp 18

## 2020-12-08 DIAGNOSIS — O019 Hydatidiform mole, unspecified: Secondary | ICD-10-CM

## 2020-12-08 DIAGNOSIS — R5383 Other fatigue: Secondary | ICD-10-CM | POA: Diagnosis not present

## 2020-12-08 DIAGNOSIS — Z793 Long term (current) use of hormonal contraceptives: Secondary | ICD-10-CM | POA: Diagnosis not present

## 2020-12-08 DIAGNOSIS — R030 Elevated blood-pressure reading, without diagnosis of hypertension: Secondary | ICD-10-CM | POA: Diagnosis not present

## 2020-12-08 DIAGNOSIS — R918 Other nonspecific abnormal finding of lung field: Secondary | ICD-10-CM | POA: Diagnosis not present

## 2020-12-08 LAB — BETA HCG QUANT (REF LAB): hCG Quant: 3 m[IU]/mL

## 2020-12-08 MED ORDER — METHOTREXATE SODIUM (PF) CHEMO INJECTION 250 MG/10ML
22.0000 mg | Freq: Once | INTRAMUSCULAR | Status: AC
  Administered 2020-12-08: 22 mg via INTRAMUSCULAR
  Filled 2020-12-08: qty 0.88

## 2020-12-08 NOTE — Telephone Encounter (Signed)
Called and given message to [place], interpreter. She will give message to Ms. Pardon.

## 2020-12-08 NOTE — Patient Instructions (Signed)
[facility] Discharge Instructions for Patients Receiving Chemotherapy  Today you received the following chemotherapy agents: methotrexate.  To help prevent nausea and vomiting after your treatment, we encourage you to take your nausea medication as directed.   If you develop nausea and vomiting that is not controlled by your nausea medication, call the clinic.   BELOW ARE SYMPTOMS THAT SHOULD BE REPORTED IMMEDIATELY:  *FEVER GREATER THAN 100.5 F  *CHILLS WITH OR WITHOUT FEVER  NAUSEA AND VOMITING THAT IS NOT CONTROLLED WITH YOUR NAUSEA MEDICATION  *UNUSUAL SHORTNESS OF BREATH  *UNUSUAL BRUISING OR BLEEDING  TENDERNESS IN MOUTH AND THROAT WITH OR WITHOUT PRESENCE OF ULCERS  *URINARY PROBLEMS  *BOWEL PROBLEMS  UNUSUAL RASH Items with * indicate a potential emergency and should be followed up as soon as possible.  Feel free to call the clinic should you have any questions or concerns. The clinic phone number is [phone].  Please show the CHEMO ALERT CARD at check-in to the Emergency Department and triage nurse.

## 2020-12-08 NOTE — Progress Notes (Signed)
Pt took her compazine at home before she showed up to her appointment.

## 2020-12-08 NOTE — Telephone Encounter (Signed)
-----   Message from Artis Delay, MD sent at 12/08/2020  8:32 AM EDT ----- Regarding: call Raynelle Fanning PLease call Raynelle Fanning and ask her to call the patient Her HCG came back 3, it is considered normal even though not zero Please is finish this week and 2 more cycles of Rx as scheduled

## 2020-12-09 ENCOUNTER — Inpatient Hospital Stay: Payer: BC Managed Care – PPO

## 2020-12-09 ENCOUNTER — Other Ambulatory Visit: Payer: Self-pay

## 2020-12-09 VITALS — BP 120/60 | HR 76 | Temp 98.1°F | Resp 18

## 2020-12-09 DIAGNOSIS — R918 Other nonspecific abnormal finding of lung field: Secondary | ICD-10-CM | POA: Diagnosis not present

## 2020-12-09 DIAGNOSIS — R5383 Other fatigue: Secondary | ICD-10-CM | POA: Diagnosis not present

## 2020-12-09 DIAGNOSIS — O019 Hydatidiform mole, unspecified: Secondary | ICD-10-CM | POA: Diagnosis not present

## 2020-12-09 DIAGNOSIS — Z793 Long term (current) use of hormonal contraceptives: Secondary | ICD-10-CM | POA: Diagnosis not present

## 2020-12-09 DIAGNOSIS — R030 Elevated blood-pressure reading, without diagnosis of hypertension: Secondary | ICD-10-CM | POA: Diagnosis not present

## 2020-12-09 MED ORDER — METHOTREXATE SODIUM (PF) CHEMO INJECTION 250 MG/10ML
22.0000 mg | Freq: Once | INTRAMUSCULAR | Status: AC
  Administered 2020-12-09: 22 mg via INTRAMUSCULAR
  Filled 2020-12-09: qty 0.88

## 2020-12-09 NOTE — Patient Instructions (Signed)
[facility] Discharge Instructions for Patients Receiving Chemotherapy  Today you received the following chemotherapy agents: Methotrexate (PF).  To help prevent nausea and vomiting after your treatment, we encourage you to take your nausea medication as prescribed.  If you develop nausea and vomiting that is not controlled by your nausea medication, call the clinic.   BELOW ARE SYMPTOMS THAT SHOULD BE REPORTED IMMEDIATELY:  *FEVER GREATER THAN 100.5 F  *CHILLS WITH OR WITHOUT FEVER  NAUSEA AND VOMITING THAT IS NOT CONTROLLED WITH YOUR NAUSEA MEDICATION  *UNUSUAL SHORTNESS OF BREATH  *UNUSUAL BRUISING OR BLEEDING  TENDERNESS IN MOUTH AND THROAT WITH OR WITHOUT PRESENCE OF ULCERS  *URINARY PROBLEMS  *BOWEL PROBLEMS  UNUSUAL RASH Items with * indicate a potential emergency and should be followed up as soon as possible.  Feel free to call the clinic should you have any questions or concerns. The clinic phone number is [phone].  Please show the CHEMO ALERT CARD at check-in to the Emergency Department and triage nurse.   

## 2020-12-10 ENCOUNTER — Inpatient Hospital Stay: Payer: BC Managed Care – PPO

## 2020-12-10 ENCOUNTER — Other Ambulatory Visit: Payer: Self-pay

## 2020-12-10 VITALS — BP 129/59 | HR 93 | Temp 98.6°F | Resp 17

## 2020-12-10 DIAGNOSIS — R918 Other nonspecific abnormal finding of lung field: Secondary | ICD-10-CM | POA: Diagnosis not present

## 2020-12-10 DIAGNOSIS — O019 Hydatidiform mole, unspecified: Secondary | ICD-10-CM | POA: Diagnosis not present

## 2020-12-10 DIAGNOSIS — R5383 Other fatigue: Secondary | ICD-10-CM | POA: Diagnosis not present

## 2020-12-10 DIAGNOSIS — R030 Elevated blood-pressure reading, without diagnosis of hypertension: Secondary | ICD-10-CM | POA: Diagnosis not present

## 2020-12-10 DIAGNOSIS — Z793 Long term (current) use of hormonal contraceptives: Secondary | ICD-10-CM | POA: Diagnosis not present

## 2020-12-10 MED ORDER — METHOTREXATE SODIUM (PF) CHEMO INJECTION 250 MG/10ML
22.0000 mg | Freq: Once | INTRAMUSCULAR | Status: AC
  Administered 2020-12-10: 22 mg via INTRAMUSCULAR
  Filled 2020-12-10: qty 0.88

## 2020-12-10 NOTE — Patient Instructions (Signed)
[facility] Discharge Instructions for Patients Receiving Chemotherapy  Today you received the following chemotherapy agents: methotrexate.  To help prevent nausea and vomiting after your treatment, we encourage you to take your nausea medication as directed.   If you develop nausea and vomiting that is not controlled by your nausea medication, call the clinic.   BELOW ARE SYMPTOMS THAT SHOULD BE REPORTED IMMEDIATELY:  *FEVER GREATER THAN 100.5 F  *CHILLS WITH OR WITHOUT FEVER  NAUSEA AND VOMITING THAT IS NOT CONTROLLED WITH YOUR NAUSEA MEDICATION  *UNUSUAL SHORTNESS OF BREATH  *UNUSUAL BRUISING OR BLEEDING  TENDERNESS IN MOUTH AND THROAT WITH OR WITHOUT PRESENCE OF ULCERS  *URINARY PROBLEMS  *BOWEL PROBLEMS  UNUSUAL RASH Items with * indicate a potential emergency and should be followed up as soon as possible.  Feel free to call the clinic should you have any questions or concerns. The clinic phone number is [phone].  Please show the CHEMO ALERT CARD at check-in to the Emergency Department and triage nurse.   

## 2020-12-11 ENCOUNTER — Inpatient Hospital Stay: Payer: BC Managed Care – PPO

## 2020-12-11 ENCOUNTER — Other Ambulatory Visit: Payer: Self-pay

## 2020-12-11 VITALS — BP 124/73 | HR 87 | Temp 97.2°F | Resp 16

## 2020-12-11 DIAGNOSIS — Z793 Long term (current) use of hormonal contraceptives: Secondary | ICD-10-CM | POA: Diagnosis not present

## 2020-12-11 DIAGNOSIS — R918 Other nonspecific abnormal finding of lung field: Secondary | ICD-10-CM | POA: Diagnosis not present

## 2020-12-11 DIAGNOSIS — O019 Hydatidiform mole, unspecified: Secondary | ICD-10-CM | POA: Diagnosis not present

## 2020-12-11 DIAGNOSIS — R5383 Other fatigue: Secondary | ICD-10-CM | POA: Diagnosis not present

## 2020-12-11 DIAGNOSIS — R030 Elevated blood-pressure reading, without diagnosis of hypertension: Secondary | ICD-10-CM | POA: Diagnosis not present

## 2020-12-11 MED ORDER — METHOTREXATE SODIUM (PF) CHEMO INJECTION 250 MG/10ML
22.0000 mg | Freq: Once | INTRAMUSCULAR | Status: AC
  Administered 2020-12-11: 22 mg via INTRAMUSCULAR
  Filled 2020-12-11: qty 0.88

## 2020-12-11 NOTE — Patient Instructions (Signed)
[facility] Discharge Instructions for Patients Receiving Chemotherapy  Today you received the following chemotherapy agents: methotrexate.  To help prevent nausea and vomiting after your treatment, we encourage you to take your nausea medication as directed.   If you develop nausea and vomiting that is not controlled by your nausea medication, call the clinic.   BELOW ARE SYMPTOMS THAT SHOULD BE REPORTED IMMEDIATELY:  *FEVER GREATER THAN 100.5 F  *CHILLS WITH OR WITHOUT FEVER  NAUSEA AND VOMITING THAT IS NOT CONTROLLED WITH YOUR NAUSEA MEDICATION  *UNUSUAL SHORTNESS OF BREATH  *UNUSUAL BRUISING OR BLEEDING  TENDERNESS IN MOUTH AND THROAT WITH OR WITHOUT PRESENCE OF ULCERS  *URINARY PROBLEMS  *BOWEL PROBLEMS  UNUSUAL RASH Items with * indicate a potential emergency and should be followed up as soon as possible.  Feel free to call the clinic should you have any questions or concerns. The clinic phone number is [phone].  Please show the CHEMO ALERT CARD at check-in to the Emergency Department and triage nurse.   

## 2020-12-21 ENCOUNTER — Inpatient Hospital Stay (HOSPITAL_BASED_OUTPATIENT_CLINIC_OR_DEPARTMENT_OTHER): Payer: BC Managed Care – PPO | Admitting: Hematology and Oncology

## 2020-12-21 ENCOUNTER — Other Ambulatory Visit: Payer: Self-pay

## 2020-12-21 ENCOUNTER — Inpatient Hospital Stay: Payer: BC Managed Care – PPO

## 2020-12-21 ENCOUNTER — Encounter: Payer: Self-pay | Admitting: Hematology and Oncology

## 2020-12-21 VITALS — BP 141/73 | HR 98 | Temp 97.5°F | Resp 20 | Ht 61.0 in | Wt 132.4 lb

## 2020-12-21 DIAGNOSIS — R5383 Other fatigue: Secondary | ICD-10-CM

## 2020-12-21 DIAGNOSIS — R030 Elevated blood-pressure reading, without diagnosis of hypertension: Secondary | ICD-10-CM | POA: Diagnosis not present

## 2020-12-21 DIAGNOSIS — R918 Other nonspecific abnormal finding of lung field: Secondary | ICD-10-CM | POA: Diagnosis not present

## 2020-12-21 DIAGNOSIS — O019 Hydatidiform mole, unspecified: Secondary | ICD-10-CM

## 2020-12-21 DIAGNOSIS — Z793 Long term (current) use of hormonal contraceptives: Secondary | ICD-10-CM | POA: Diagnosis not present

## 2020-12-21 LAB — CBC WITH DIFFERENTIAL (CANCER CENTER ONLY)
Abs Immature Granulocytes: 0.03 10*3/uL (ref 0.00–0.07)
Basophils Absolute: 0.1 10*3/uL (ref 0.0–0.1)
Basophils Relative: 1 %
Eosinophils Absolute: 0.2 10*3/uL (ref 0.0–0.5)
Eosinophils Relative: 2 %
HCT: 37.9 % (ref 36.0–46.0)
Hemoglobin: 12.9 g/dL (ref 12.0–15.0)
Immature Granulocytes: 0 %
Lymphocytes Relative: 26 %
Lymphs Abs: 2.2 10*3/uL (ref 0.7–4.0)
MCH: 30.8 pg (ref 26.0–34.0)
MCHC: 34 g/dL (ref 30.0–36.0)
MCV: 90.5 fL (ref 80.0–100.0)
Monocytes Absolute: 0.6 10*3/uL (ref 0.1–1.0)
Monocytes Relative: 7 %
Neutro Abs: 5.4 10*3/uL (ref 1.7–7.7)
Neutrophils Relative %: 64 %
Platelet Count: 355 10*3/uL (ref 150–400)
RBC: 4.19 MIL/uL (ref 3.87–5.11)
RDW: 15.6 % — ABNORMAL HIGH (ref 11.5–15.5)
WBC Count: 8.4 10*3/uL (ref 4.0–10.5)
nRBC: 0 % (ref 0.0–0.2)

## 2020-12-21 LAB — CMP (CANCER CENTER ONLY)
ALT: 14 U/L (ref 0–44)
AST: 15 U/L (ref 15–41)
Albumin: 4.1 g/dL (ref 3.5–5.0)
Alkaline Phosphatase: 76 U/L (ref 38–126)
Anion gap: 10 (ref 5–15)
BUN: 7 mg/dL (ref 6–20)
CO2: 23 mmol/L (ref 22–32)
Calcium: 9.1 mg/dL (ref 8.9–10.3)
Chloride: 105 mmol/L (ref 98–111)
Creatinine: 0.71 mg/dL (ref 0.44–1.00)
GFR, Estimated: >60 mL/min (ref >60)
Glucose, Bld: 88 mg/dL (ref 70–99)
Potassium: 3.9 mmol/L (ref 3.5–5.1)
Sodium: 138 mmol/L (ref 135–145)
Total Bilirubin: 0.4 mg/dL (ref 0.3–1.2)
Total Protein: 8 g/dL (ref 6.5–8.1)

## 2020-12-21 LAB — TSH: TSH: 1.475 u[IU]/mL (ref 0.308–3.960)

## 2020-12-21 MED ORDER — METHOTREXATE SODIUM (PF) CHEMO INJECTION 250 MG/10ML
22.0000 mg | Freq: Once | INTRAMUSCULAR | Status: AC
  Administered 2020-12-21: 22 mg via INTRAMUSCULAR
  Filled 2020-12-21: qty 0.88

## 2020-12-21 NOTE — Assessment & Plan Note (Signed)
So far, she tolerated treatment very well without major side effects  Due to her social circumstances, I will try to minimize waiting time for treatment She is instructed to take premedication Compazine an hour before each chemo appointment She is instructed to take daily folic acid to avoid risk of pancytopenia She will get tumor marker monitoring every other week Her last beta-hCG 2 weeks ago came back within normal range We will proceed with treatment as scheduled Her last dose of treatment will be April 15 and I plan to order MRI after that I will call her with results of her hCG tomorrow She is in agreement to proceed with the plan of care

## 2020-12-21 NOTE — Assessment & Plan Note (Signed)
This could be due to anxiety Observe for now 

## 2020-12-21 NOTE — Progress Notes (Signed)
Pt. states she took her compazine at home prior to appointment.

## 2020-12-21 NOTE — Patient Instructions (Signed)
[facility] Discharge Instructions for Patients Receiving Chemotherapy  Today you received the following chemotherapy agent: Methotrexate  To help prevent nausea and vomiting after your treatment, we encourage you to take your nausea medication as directed by your MD.   If you develop nausea and vomiting that is not controlled by your nausea medication, call the clinic.   BELOW ARE SYMPTOMS THAT SHOULD BE REPORTED IMMEDIATELY:  *FEVER GREATER THAN 100.5 F  *CHILLS WITH OR WITHOUT FEVER  NAUSEA AND VOMITING THAT IS NOT CONTROLLED WITH YOUR NAUSEA MEDICATION  *UNUSUAL SHORTNESS OF BREATH  *UNUSUAL BRUISING OR BLEEDING  TENDERNESS IN MOUTH AND THROAT WITH OR WITHOUT PRESENCE OF ULCERS  *URINARY PROBLEMS  *BOWEL PROBLEMS  UNUSUAL RASH Items with * indicate a potential emergency and should be followed up as soon as possible.  Feel free to call the clinic should you have any questions or concerns. The clinic phone number is [phone].  Please show the CHEMO ALERT CARD at check-in to the Emergency Department and triage nurse.

## 2020-12-21 NOTE — Progress Notes (Signed)
[name] OFFICE PROGRESS NOTE  Patient Care Team: Patient, No Pcp Per as PCP - General (General Practice) Paulina Fusi, Servando Snare, RN as Oncology Nurse Navigator (Oncology)  ASSESSMENT & PLAN:  Gestational trophoblastic neoplasm So far, she tolerated treatment very well without major side effects  Due to her social circumstances, I will try to minimize waiting time for treatment She is instructed to take premedication Compazine an hour before each chemo appointment She is instructed to take daily folic acid to avoid risk of pancytopenia She will get tumor marker monitoring every other week Her last beta-hCG 2 weeks ago came back within normal range We will proceed with treatment as scheduled Her last dose of treatment will be April 15 and I plan to order MRI after that I will call her with results of her hCG tomorrow She is in agreement to proceed with the plan of care   Elevated BP without diagnosis of hypertension This could be due to anxiety Observe for now   Orders Placed This Encounter  Procedures  . MR Pelvis W Wo Contrast    Standing Status:   Future    Standing Expiration Date:   12/21/2021    Order Specific Question:   If indicated for the ordered procedure, I authorize the administration of contrast media per Radiology protocol    Answer:   Yes    Order Specific Question:   What is the patient's sedation requirement?    Answer:   No Sedation    Order Specific Question:   Does the patient have a pacemaker or implanted devices?    Answer:   No    Order Specific Question:   Preferred imaging location?    Answer:   [facility] (table limit - 550 lbs)    All questions were answered. The patient knows to call the clinic with any problems, questions or concerns. The total time spent in the appointment was 20 minutes encounter with patients including review of chart and various tests results, discussions about plan of care and coordination of care plan    Artis Delay, MD 12/21/2020 3:02 PM  INTERVAL HISTORY: Please see below for problem oriented charting. She returns with her husband for further follow-up She is doing well She denies nausea She is still taking her birth control pills and folic acid supplement Tolerated chemo fairly well without major side effects I have reviewed her case briefly with Dr. Pricilla Holm about her recent excellent response to treatment  SUMMARY OF ONCOLOGIC HISTORY: Maria [name] 30 y.o. 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 Summary of oncologic history is as follows: The patient probably has missed miscarriage late last year She has 1 son born in 2017 The patient is otherwise healthy She is currently not working Her husband, Maria [name], works night shift between 11 PM to 7:30 AM, with sporadic schedule, in general, 6 days block each time  On July 10, 2020, she underwent ultrasound of the pelvis which showed  Complex very thickened  endometrium consistent with possible molar pregnancy, definitely a non viable pregnancy [id number] Estimated Date of Delivery: None noted.  Normal general sonographic findings  Her gynecologist performed suction and sharp uterine evacuation, pathology showed FINAL MICROSCOPIC DIAGNOSIS:   A. PRODUCTS  OF CONCEPTION, DILATATION AND EVACUATION:  - Chorionic villi with features consistent with molar gestation.  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 Solid 5 mm nodule in the left lower lobe, with additional scattered 54mm or smaller pulmonary nodules. Which in the setting of known malignancy are suspicious for metastatic involvement.  2. Please refer to same day MRI of the pelvis for findings regarding uterine gestational trophoblastic disease.  3.  No evidence of metastatic disease in the abdomen or pelvis.  MRI of the pelvis on October 12, 2020 showed  2.4 cm ill-defined hyperenhancing lesion in the right posterior fundal myometrium, suspicious for invasive mole.  No evidence of extra-uterine metastatic disease.  The patient is not symptomatic She denies pelvic pain or excessive vaginal bleeding Her appetite is normal She desire further pregnancy MRI brain performed on October 16, 2020 showed no evidence of disease On October 26, 2020, she will proceed with cycle 1 of methotrexate   REVIEW OF SYSTEMS:   Constitutional: Denies fevers, chills or abnormal weight loss Eyes: Denies blurriness of vision Ears, nose, mouth, throat, and face: Denies mucositis or sore throat Respiratory: Denies cough, dyspnea or wheezes Cardiovascular: Denies palpitation, chest discomfort or lower extremity swelling Gastrointestinal:  Denies nausea, heartburn or change in bowel habits Skin: Denies abnormal skin rashes Lymphatics: Denies new lymphadenopathy or easy bruising Neurological:Denies numbness, tingling or new weaknesses Behavioral/Psych: Mood is stable, no new changes  All other systems were reviewed with the patient and are negative.  I have reviewed the past  medical history, past surgical history, social history and family history with the patient and they are unchanged from previous note.  ALLERGIES:  has No Known Allergies.  MEDICATIONS:  Current Outpatient Medications  Medication Sig Dispense Refill  . desogestrel-ethinyl estradiol (APRI) 0.15-30 MG-MCG tablet Take 1 tablet by mouth daily. 28 tablet 11  . Ferrous Sulfate (IRON PO) Take 1 tablet by mouth daily.    . folic acid (FOLVITE) 1 MG tablet Take 1 tablet (1 mg total) by mouth daily. 30 tablet 11  . Multiple Vitamin (MULTIVITAMIN PO) Take 1 tablet by mouth daily.    . ondansetron (ZOFRAN) 8 MG tablet Take 1 tablet (8 mg total) by mouth every 8 (eight) hours as needed (Nausea or vomiting). 30 tablet 1  . prochlorperazine (COMPAZINE) 10 MG tablet Take 1 tablet (10 mg total) by mouth every 6 (six) hours as needed (Nausea or vomiting). 30 tablet 1   No current facility-administered medications for this visit.    PHYSICAL EXAMINATION: ECOG PERFORMANCE STATUS: 1 - Symptomatic but completely ambulatory  Vitals:   12/21/20 1447  BP: (!) 141/73  Pulse: 98  Resp: 20  Temp: (!) 97.5 F (36.4 C)  SpO2: 100%   Filed Weights   12/21/20 1447  Weight: 132 lb 6.4 oz (60.1 kg)    GENERAL:alert, no distress and comfortable NEURO: alert & oriented x 3 with fluent speech, no focal  motor/sensory deficits  LABORATORY DATA:  I have reviewed the data as listed    Component Value Date/Time   NA 140 12/07/2020 1436   K 3.4 (L) 12/07/2020 1436   CL 106 12/07/2020 1436   CO2 26 12/07/2020 1436   GLUCOSE 122 (H) 12/07/2020 1436   BUN 8 12/07/2020 1436   CREATININE 0.73 12/07/2020 1436   CREATININE 0.60 08/20/2014 1505   CALCIUM 9.4 12/07/2020 1436   PROT 8.2 (H) 12/07/2020 1436   ALBUMIN 4.4 12/07/2020 1436   AST 17 12/07/2020 1436   ALT 18 12/07/2020 1436   ALKPHOS 81 12/07/2020 1436   BILITOT 0.4 12/07/2020 1436   GFRNONAA >60 12/07/2020 1436    No results found for: SPEP,  UPEP  Lab Results  Component Value Date   WBC 8.4 12/21/2020   NEUTROABS 5.4 12/21/2020   HGB 12.9 12/21/2020   HCT 37.9 12/21/2020   MCV 90.5 12/21/2020   PLT 355 12/21/2020      Chemistry      Component Value Date/Time   NA 140 12/07/2020 1436   K 3.4 (L) 12/07/2020 1436   CL 106 12/07/2020 1436   CO2 26 12/07/2020 1436   BUN 8 12/07/2020 1436   CREATININE 0.73 12/07/2020 1436   CREATININE 0.60 08/20/2014 1505      Component Value Date/Time   CALCIUM 9.4 12/07/2020 1436   ALKPHOS 81 12/07/2020 1436   AST 17 12/07/2020 1436   ALT 18 12/07/2020 1436   BILITOT 0.4 12/07/2020 1436

## 2020-12-22 ENCOUNTER — Other Ambulatory Visit: Payer: Self-pay

## 2020-12-22 ENCOUNTER — Inpatient Hospital Stay: Payer: BC Managed Care – PPO

## 2020-12-22 VITALS — BP 123/66 | HR 73 | Temp 97.0°F | Resp 18 | Wt 135.8 lb

## 2020-12-22 DIAGNOSIS — Z793 Long term (current) use of hormonal contraceptives: Secondary | ICD-10-CM | POA: Diagnosis not present

## 2020-12-22 DIAGNOSIS — O019 Hydatidiform mole, unspecified: Secondary | ICD-10-CM | POA: Diagnosis not present

## 2020-12-22 DIAGNOSIS — R030 Elevated blood-pressure reading, without diagnosis of hypertension: Secondary | ICD-10-CM | POA: Diagnosis not present

## 2020-12-22 DIAGNOSIS — R5383 Other fatigue: Secondary | ICD-10-CM | POA: Diagnosis not present

## 2020-12-22 DIAGNOSIS — R918 Other nonspecific abnormal finding of lung field: Secondary | ICD-10-CM | POA: Diagnosis not present

## 2020-12-22 LAB — BETA HCG QUANT (REF LAB): hCG Quant: 1 m[IU]/mL

## 2020-12-22 MED ORDER — METHOTREXATE SODIUM (PF) CHEMO INJECTION 250 MG/10ML
22.0000 mg | Freq: Once | INTRAMUSCULAR | Status: AC
  Administered 2020-12-22: 22 mg via INTRAMUSCULAR
  Filled 2020-12-22: qty 0.88

## 2020-12-22 NOTE — Patient Instructions (Signed)
[facility] Discharge Instructions for Patients Receiving Chemotherapy  Today you received the following chemotherapy agents: methotrexate.  To help prevent nausea and vomiting after your treatment, we encourage you to take your nausea medication as directed.   If you develop nausea and vomiting that is not controlled by your nausea medication, call the clinic.   BELOW ARE SYMPTOMS THAT SHOULD BE REPORTED IMMEDIATELY:  *FEVER GREATER THAN 100.5 F  *CHILLS WITH OR WITHOUT FEVER  NAUSEA AND VOMITING THAT IS NOT CONTROLLED WITH YOUR NAUSEA MEDICATION  *UNUSUAL SHORTNESS OF BREATH  *UNUSUAL BRUISING OR BLEEDING  TENDERNESS IN MOUTH AND THROAT WITH OR WITHOUT PRESENCE OF ULCERS  *URINARY PROBLEMS  *BOWEL PROBLEMS  UNUSUAL RASH Items with * indicate a potential emergency and should be followed up as soon as possible.  Feel free to call the clinic should you have any questions or concerns. The clinic phone number is [phone].  Please show the CHEMO ALERT CARD at check-in to the Emergency Department and triage nurse.   

## 2020-12-23 ENCOUNTER — Inpatient Hospital Stay: Payer: BC Managed Care – PPO

## 2020-12-23 ENCOUNTER — Other Ambulatory Visit: Payer: Self-pay

## 2020-12-23 VITALS — BP 129/62 | HR 67 | Temp 98.5°F | Resp 18

## 2020-12-23 DIAGNOSIS — R030 Elevated blood-pressure reading, without diagnosis of hypertension: Secondary | ICD-10-CM | POA: Diagnosis not present

## 2020-12-23 DIAGNOSIS — R918 Other nonspecific abnormal finding of lung field: Secondary | ICD-10-CM | POA: Diagnosis not present

## 2020-12-23 DIAGNOSIS — O019 Hydatidiform mole, unspecified: Secondary | ICD-10-CM

## 2020-12-23 DIAGNOSIS — R5383 Other fatigue: Secondary | ICD-10-CM | POA: Diagnosis not present

## 2020-12-23 DIAGNOSIS — Z793 Long term (current) use of hormonal contraceptives: Secondary | ICD-10-CM | POA: Diagnosis not present

## 2020-12-23 MED ORDER — METHOTREXATE SODIUM (PF) CHEMO INJECTION 250 MG/10ML
22.0000 mg | Freq: Once | INTRAMUSCULAR | Status: AC
  Administered 2020-12-23: 22 mg via INTRAMUSCULAR
  Filled 2020-12-23: qty 0.88

## 2020-12-24 ENCOUNTER — Inpatient Hospital Stay: Payer: BC Managed Care – PPO

## 2020-12-24 VITALS — BP 128/68 | HR 73 | Temp 98.4°F | Resp 17

## 2020-12-24 DIAGNOSIS — R918 Other nonspecific abnormal finding of lung field: Secondary | ICD-10-CM | POA: Diagnosis not present

## 2020-12-24 DIAGNOSIS — O019 Hydatidiform mole, unspecified: Secondary | ICD-10-CM

## 2020-12-24 DIAGNOSIS — R5383 Other fatigue: Secondary | ICD-10-CM | POA: Diagnosis not present

## 2020-12-24 DIAGNOSIS — Z793 Long term (current) use of hormonal contraceptives: Secondary | ICD-10-CM | POA: Diagnosis not present

## 2020-12-24 DIAGNOSIS — R030 Elevated blood-pressure reading, without diagnosis of hypertension: Secondary | ICD-10-CM | POA: Diagnosis not present

## 2020-12-24 MED ORDER — METHOTREXATE SODIUM (PF) CHEMO INJECTION 250 MG/10ML
22.0000 mg | Freq: Once | INTRAMUSCULAR | Status: AC
  Administered 2020-12-24: 22 mg via INTRAMUSCULAR
  Filled 2020-12-24: qty 0.88

## 2020-12-24 NOTE — Patient Instructions (Signed)
[facility] Discharge Instructions for Patients Receiving Chemotherapy  Today you received the following chemotherapy agents: methotrexate.  To help prevent nausea and vomiting after your treatment, we encourage you to take your nausea medication as directed.   If you develop nausea and vomiting that is not controlled by your nausea medication, call the clinic.   BELOW ARE SYMPTOMS THAT SHOULD BE REPORTED IMMEDIATELY:  *FEVER GREATER THAN 100.5 F  *CHILLS WITH OR WITHOUT FEVER  NAUSEA AND VOMITING THAT IS NOT CONTROLLED WITH YOUR NAUSEA MEDICATION  *UNUSUAL SHORTNESS OF BREATH  *UNUSUAL BRUISING OR BLEEDING  TENDERNESS IN MOUTH AND THROAT WITH OR WITHOUT PRESENCE OF ULCERS  *URINARY PROBLEMS  *BOWEL PROBLEMS  UNUSUAL RASH Items with * indicate a potential emergency and should be followed up as soon as possible.  Feel free to call the clinic should you have any questions or concerns. The clinic phone number is [phone].  Please show the CHEMO ALERT CARD at check-in to the Emergency Department and triage nurse.   

## 2020-12-25 ENCOUNTER — Other Ambulatory Visit: Payer: Self-pay

## 2020-12-25 ENCOUNTER — Inpatient Hospital Stay: Payer: BC Managed Care – PPO | Attending: Gynecologic Oncology

## 2020-12-25 VITALS — BP 132/58 | HR 77 | Temp 97.7°F | Resp 16

## 2020-12-25 DIAGNOSIS — Z5111 Encounter for antineoplastic chemotherapy: Secondary | ICD-10-CM | POA: Diagnosis not present

## 2020-12-25 DIAGNOSIS — O019 Hydatidiform mole, unspecified: Secondary | ICD-10-CM

## 2020-12-25 MED ORDER — METHOTREXATE SODIUM (PF) CHEMO INJECTION 250 MG/10ML
22.0000 mg | Freq: Once | INTRAMUSCULAR | Status: AC
  Administered 2020-12-25: 22 mg via INTRAMUSCULAR
  Filled 2020-12-25: qty 0.88

## 2020-12-25 NOTE — Patient Instructions (Signed)
[facility] for Patients Receiving Chemotherapy  Today you received the following chemotherapy agents Methotrexate  To help prevent nausea and vomiting after your treatment, we encourage you to take your nausea medication as directed.   If you develop nausea and vomiting that is not controlled by your nausea medication, call the clinic.   BELOW ARE SYMPTOMS THAT SHOULD BE REPORTED IMMEDIATELY:  *FEVER GREATER THAN 100.5 F  *CHILLS WITH OR WITHOUT FEVER  NAUSEA AND VOMITING THAT IS NOT CONTROLLED WITH YOUR NAUSEA MEDICATION  *UNUSUAL SHORTNESS OF BREATH  *UNUSUAL BRUISING OR BLEEDING  TENDERNESS IN MOUTH AND THROAT WITH OR WITHOUT PRESENCE OF ULCERS  *URINARY PROBLEMS  *BOWEL PROBLEMS  UNUSUAL RASH Items with * indicate a potential emergency and should be followed up as soon as possible.  Feel free to call the clinic should you have any questions or concerns. The clinic phone number is [phone].  Please show the CHEMO ALERT CARD at check-in to the Emergency Department and triage nurse.   

## 2021-01-04 ENCOUNTER — Other Ambulatory Visit: Payer: Self-pay

## 2021-01-04 ENCOUNTER — Inpatient Hospital Stay (HOSPITAL_BASED_OUTPATIENT_CLINIC_OR_DEPARTMENT_OTHER): Payer: BC Managed Care – PPO | Admitting: Hematology and Oncology

## 2021-01-04 ENCOUNTER — Inpatient Hospital Stay: Payer: BC Managed Care – PPO

## 2021-01-04 ENCOUNTER — Encounter: Payer: Self-pay | Admitting: Hematology and Oncology

## 2021-01-04 DIAGNOSIS — O019 Hydatidiform mole, unspecified: Secondary | ICD-10-CM

## 2021-01-04 DIAGNOSIS — Z5111 Encounter for antineoplastic chemotherapy: Secondary | ICD-10-CM | POA: Diagnosis not present

## 2021-01-04 LAB — CBC WITH DIFFERENTIAL (CANCER CENTER ONLY)
Abs Immature Granulocytes: 0.01 10*3/uL (ref 0.00–0.07)
Basophils Absolute: 0.1 10*3/uL (ref 0.0–0.1)
Basophils Relative: 1 %
Eosinophils Absolute: 0.2 10*3/uL (ref 0.0–0.5)
Eosinophils Relative: 2 %
HCT: 39.3 % (ref 36.0–46.0)
Hemoglobin: 13.1 g/dL (ref 12.0–15.0)
Immature Granulocytes: 0 %
Lymphocytes Relative: 30 %
Lymphs Abs: 2.3 10*3/uL (ref 0.7–4.0)
MCH: 31 pg (ref 26.0–34.0)
MCHC: 33.3 g/dL (ref 30.0–36.0)
MCV: 92.9 fL (ref 80.0–100.0)
Monocytes Absolute: 0.7 10*3/uL (ref 0.1–1.0)
Monocytes Relative: 9 %
Neutro Abs: 4.4 10*3/uL (ref 1.7–7.7)
Neutrophils Relative %: 58 %
Platelet Count: 361 10*3/uL (ref 150–400)
RBC: 4.23 MIL/uL (ref 3.87–5.11)
RDW: 15.5 % (ref 11.5–15.5)
WBC Count: 7.7 10*3/uL (ref 4.0–10.5)
nRBC: 0 % (ref 0.0–0.2)

## 2021-01-04 LAB — CMP (CANCER CENTER ONLY)
ALT: 17 U/L (ref 0–44)
AST: 21 U/L (ref 15–41)
Albumin: 4.2 g/dL (ref 3.5–5.0)
Alkaline Phosphatase: 74 U/L (ref 38–126)
Anion gap: 11 (ref 5–15)
BUN: 8 mg/dL (ref 6–20)
CO2: 26 mmol/L (ref 22–32)
Calcium: 9.4 mg/dL (ref 8.9–10.3)
Chloride: 103 mmol/L (ref 98–111)
Creatinine: 0.73 mg/dL (ref 0.44–1.00)
GFR, Estimated: >60 mL/min (ref >60)
Glucose, Bld: 99 mg/dL (ref 70–99)
Potassium: 3.8 mmol/L (ref 3.5–5.1)
Sodium: 140 mmol/L (ref 135–145)
Total Bilirubin: 0.3 mg/dL (ref 0.3–1.2)
Total Protein: 7.8 g/dL (ref 6.5–8.1)

## 2021-01-04 MED ORDER — METHOTREXATE SODIUM (PF) CHEMO INJECTION 250 MG/10ML
22.0000 mg | Freq: Once | INTRAMUSCULAR | Status: AC
  Administered 2021-01-04: 22 mg via INTRAMUSCULAR
  Filled 2021-01-04: qty 0.88

## 2021-01-04 NOTE — Patient Instructions (Signed)
[facility] Discharge Instructions for Patients Receiving Chemotherapy  Today you received the following chemotherapy agents Methotrexate  To help prevent nausea and vomiting after your treatment, we encourage you to take your nausea medication as directed.   If you develop nausea and vomiting that is not controlled by your nausea medication, call the clinic.   BELOW ARE SYMPTOMS THAT SHOULD BE REPORTED IMMEDIATELY:  *FEVER GREATER THAN 100.5 F  *CHILLS WITH OR WITHOUT FEVER  NAUSEA AND VOMITING THAT IS NOT CONTROLLED WITH YOUR NAUSEA MEDICATION  *UNUSUAL SHORTNESS OF BREATH  *UNUSUAL BRUISING OR BLEEDING  TENDERNESS IN MOUTH AND THROAT WITH OR WITHOUT PRESENCE OF ULCERS  *URINARY PROBLEMS  *BOWEL PROBLEMS  UNUSUAL RASH Items with * indicate a potential emergency and should be followed up as soon as possible.  Feel free to call the clinic should you have any questions or concerns. The clinic phone number is [phone].  Please show the CHEMO ALERT CARD at check-in to the Emergency Department and triage nurse.   

## 2021-01-04 NOTE — Progress Notes (Signed)
[facility] OFFICE PROGRESS NOTE  Patient Care Team: Patient, No Pcp Per (Inactive) as PCP - General (General Practice) Paulina Fusi, Servando Snare, RN as Oncology Nurse Navigator (Oncology)  ASSESSMENT & PLAN:  Gestational trophoblastic neoplasm So far, she tolerated treatment very well without major side effects  She is instructed to take premedication Compazine an hour before each chemo appointment She is instructed to take daily folic acid to avoid risk of pancytopenia; suggest she continue folic acid supplementation for 1 more month beyond the last dose of treatment before stopping Her recent tumor marker showed probable complete response to treatment We will proceed with treatment as scheduled She is instructed to continue on her birth control pill until she is assessed fully by GYN surgeon next month Her last dose of treatment will be April 15 and she is scheduled for MRI of the pelvis for evaluation at the end of the month and then she will see GYN surgeon for further follow-up    No orders of the defined types were placed in this encounter.   All questions were answered. The patient knows to call the clinic with any problems, questions or concerns. The total time spent in the appointment was 20 minutes encounter with patients including review of chart and various tests results, discussions about plan of care and coordination of care plan   Artis Delay, MD 01/04/2021 3:19 PM  INTERVAL HISTORY: Please see below for problem oriented charting. She returns to complete treatment this week She is doing well Denies nausea or side effects from therapy She has occasional mild fatigue She continues to have regular menstruation when she takes the placebo on her birth control pill Denies mucositis   SUMMARY OF ONCOLOGIC HISTORY: Maria [name] 30 y.o. 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 Summary of oncologic history is as follows: The patient probably has missed miscarriage late last year She has 1 son born in 2017 The patient is otherwise healthy She is currently not working Her husband, [name], works night shift between 11 PM to 7:30 AM, with sporadic schedule, in general, 6 days block each time  On July 10, 2020, she underwent ultrasound of the pelvis which showed  Complex very thickened  endometrium consistent with possible molar pregnancy, definitely a non viable pregnancy [id number] Estimated Date of Delivery: None noted.  Normal general sonographic findings  Her gynecologist performed suction and sharp uterine evacuation, pathology showed FINAL MICROSCOPIC DIAGNOSIS:   A. PRODUCTS OF CONCEPTION, DILATATION AND EVACUATION:  - Chorionic villi with features consistent with molar gestation.  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 Solid 5 mm nodule in the left lower lobe, with additional scattered 34mm or smaller  pulmonary nodules. Which in the setting of known malignancy are suspicious for metastatic involvement.  2. Please refer to same day MRI of the  pelvis for findings regarding uterine gestational trophoblastic disease.  3.  No evidence of metastatic disease in the abdomen or pelvis.  MRI of the pelvis on October 12, 2020 showed  2.4 cm ill-defined hyperenhancing lesion in the right posterior fundal myometrium, suspicious for invasive mole.  No evidence of extra-uterine metastatic disease.  The patient is not symptomatic She denies pelvic pain or excessive vaginal bleeding Her appetite is normal She desire further pregnancy MRI brain performed on October 16, 2020 showed no evidence of disease On October 26, 2020, she will proceed with cycle 1 of methotrexate  REVIEW OF SYSTEMS:   Constitutional: Denies fevers, chills or abnormal weight loss Eyes: Denies blurriness of vision Ears, nose, mouth, throat, and face: Denies mucositis or sore throat Respiratory: Denies cough, dyspnea or wheezes Cardiovascular: Denies palpitation, chest discomfort or lower extremity swelling Gastrointestinal:  Denies nausea, heartburn or change in bowel habits Skin: Denies abnormal skin rashes Lymphatics: Denies new lymphadenopathy or easy bruising Neurological:Denies numbness, tingling or new weaknesses Behavioral/Psych: Mood is stable, no new changes  All other systems were reviewed with the patient and are negative.  I have reviewed the past medical history, past surgical history, social history and family history with the patient and they are unchanged from previous note.  ALLERGIES:  has No Known Allergies.  MEDICATIONS:  Current Outpatient Medications  Medication Sig Dispense Refill  . desogestrel-ethinyl estradiol (APRI) 0.15-30 MG-MCG tablet Take 1 tablet by mouth daily. 28 tablet 11  . Ferrous Sulfate (IRON PO) Take 1 tablet by mouth daily.    . folic acid (FOLVITE) 1 MG tablet Take 1 tablet (1 mg total) by mouth daily. 30 tablet 11  . Multiple Vitamin (MULTIVITAMIN PO) Take 1 tablet by mouth daily.    . ondansetron (ZOFRAN) 8 MG  tablet Take 1 tablet (8 mg total) by mouth every 8 (eight) hours as needed (Nausea or vomiting). 30 tablet 1  . prochlorperazine (COMPAZINE) 10 MG tablet Take 1 tablet (10 mg total) by mouth every 6 (six) hours as needed (Nausea or vomiting). 30 tablet 1   No current facility-administered medications for this visit.    PHYSICAL EXAMINATION: ECOG PERFORMANCE STATUS: 1 - Symptomatic but completely ambulatory  Vitals:   01/04/21 1515  BP: (!) 146/67  Pulse: 91  Resp: 20  Temp: 97.7 F (36.5 C)  SpO2: 100%   Filed Weights   01/04/21 1515  Weight: 136 lb 6.4 oz (61.9 kg)    GENERAL:alert, no distress and comfortable SKIN: skin color, texture, turgor are normal, no rashes or significant lesions EYES: normal, Conjunctiva are pink and non-injected, sclera clear OROPHARYNX:no exudate, no erythema and lips, buccal mucosa, and tongue normal  NECK: supple, thyroid normal size, non-tender, without nodularity LYMPH:  no palpable lymphadenopathy in the cervical, axillary or inguinal LUNGS: clear to auscultation and percussion with normal breathing effort HEART: regular rate & rhythm and no murmurs and no lower extremity edema ABDOMEN:abdomen soft, non-tender and normal bowel sounds Musculoskeletal:no cyanosis of digits and no clubbing  NEURO: alert & oriented x 3 with fluent speech, no focal motor/sensory deficits  LABORATORY DATA:  I have reviewed the data as listed    Component Value Date/Time   NA 138 12/21/2020 1434   K 3.9 12/21/2020 1434   CL 105 12/21/2020 1434   CO2 23 12/21/2020 1434   GLUCOSE  88 12/21/2020 1434   BUN 7 12/21/2020 1434   CREATININE 0.71 12/21/2020 1434   CREATININE 0.60 08/20/2014 1505   CALCIUM 9.1 12/21/2020 1434   PROT 8.0 12/21/2020 1434   ALBUMIN 4.1 12/21/2020 1434   AST 15 12/21/2020 1434   ALT 14 12/21/2020 1434   ALKPHOS 76 12/21/2020 1434   BILITOT 0.4 12/21/2020 1434   GFRNONAA >60 12/21/2020 1434    No results found for: SPEP,  UPEP  Lab Results  Component Value Date   WBC 7.7 01/04/2021   NEUTROABS 4.4 01/04/2021   HGB 13.1 01/04/2021   HCT 39.3 01/04/2021   MCV 92.9 01/04/2021   PLT 361 01/04/2021      Chemistry      Component Value Date/Time   NA 138 12/21/2020 1434   K 3.9 12/21/2020 1434   CL 105 12/21/2020 1434   CO2 23 12/21/2020 1434   BUN 7 12/21/2020 1434   CREATININE 0.71 12/21/2020 1434   CREATININE 0.60 08/20/2014 1505      Component Value Date/Time   CALCIUM 9.1 12/21/2020 1434   ALKPHOS 76 12/21/2020 1434   AST 15 12/21/2020 1434   ALT 14 12/21/2020 1434   BILITOT 0.4 12/21/2020 1434

## 2021-01-04 NOTE — Assessment & Plan Note (Addendum)
So far, she tolerated treatment very well without major side effects  She is instructed to take premedication Compazine an hour before each chemo appointment She is instructed to take daily folic acid to avoid risk of pancytopenia; suggest she continue folic acid supplementation for 1 more month beyond the last dose of treatment before stopping Her recent tumor marker showed probable complete response to treatment We will proceed with treatment as scheduled She is instructed to continue on her birth control pill until she is assessed fully by GYN surgeon next month Her last dose of treatment will be April 15 and she is scheduled for MRI of the pelvis for evaluation at the end of the month and then she will see GYN surgeon for further follow-up

## 2021-01-05 ENCOUNTER — Inpatient Hospital Stay: Payer: BC Managed Care – PPO

## 2021-01-05 VITALS — BP 134/63 | HR 83 | Temp 98.1°F | Resp 18

## 2021-01-05 DIAGNOSIS — Z5111 Encounter for antineoplastic chemotherapy: Secondary | ICD-10-CM | POA: Diagnosis not present

## 2021-01-05 DIAGNOSIS — O019 Hydatidiform mole, unspecified: Secondary | ICD-10-CM | POA: Diagnosis not present

## 2021-01-05 LAB — BETA HCG QUANT (REF LAB): hCG Quant: <1 m[IU]/mL

## 2021-01-05 MED ORDER — METHOTREXATE SODIUM (PF) CHEMO INJECTION 250 MG/10ML
22.0000 mg | Freq: Once | INTRAMUSCULAR | Status: AC
  Administered 2021-01-05: 22 mg via INTRAMUSCULAR
  Filled 2021-01-05: qty 0.88

## 2021-01-05 NOTE — Patient Instructions (Signed)
[facility] Discharge Instructions for Patients Receiving Chemotherapy  Today you received the following chemotherapy agents Methotrexate  To help prevent nausea and vomiting after your treatment, we encourage you to take your nausea medication as directed.   If you develop nausea and vomiting that is not controlled by your nausea medication, call the clinic.   BELOW ARE SYMPTOMS THAT SHOULD BE REPORTED IMMEDIATELY:  *FEVER GREATER THAN 100.5 F  *CHILLS WITH OR WITHOUT FEVER  NAUSEA AND VOMITING THAT IS NOT CONTROLLED WITH YOUR NAUSEA MEDICATION  *UNUSUAL SHORTNESS OF BREATH  *UNUSUAL BRUISING OR BLEEDING  TENDERNESS IN MOUTH AND THROAT WITH OR WITHOUT PRESENCE OF ULCERS  *URINARY PROBLEMS  *BOWEL PROBLEMS  UNUSUAL RASH Items with * indicate a potential emergency and should be followed up as soon as possible.  Feel free to call the clinic should you have any questions or concerns. The clinic phone number is [phone].  Please show the CHEMO ALERT CARD at check-in to the Emergency Department and triage nurse.   

## 2021-01-06 ENCOUNTER — Inpatient Hospital Stay: Payer: BC Managed Care – PPO

## 2021-01-06 ENCOUNTER — Other Ambulatory Visit: Payer: Self-pay

## 2021-01-06 VITALS — BP 128/59 | HR 80 | Temp 98.7°F | Resp 16

## 2021-01-06 DIAGNOSIS — O019 Hydatidiform mole, unspecified: Secondary | ICD-10-CM | POA: Diagnosis not present

## 2021-01-06 DIAGNOSIS — Z5111 Encounter for antineoplastic chemotherapy: Secondary | ICD-10-CM | POA: Diagnosis not present

## 2021-01-06 MED ORDER — METHOTREXATE SODIUM (PF) CHEMO INJECTION 250 MG/10ML
22.0000 mg | Freq: Once | INTRAMUSCULAR | Status: AC
  Administered 2021-01-06: 22 mg via INTRAMUSCULAR
  Filled 2021-01-06: qty 0.88

## 2021-01-06 NOTE — Patient Instructions (Signed)
[facility] Discharge Instructions for Patients Receiving Chemotherapy  Today you received the following chemotherapy agents Methotrexate  To help prevent nausea and vomiting after your treatment, we encourage you to take your nausea medication as directed.   If you develop nausea and vomiting that is not controlled by your nausea medication, call the clinic.   BELOW ARE SYMPTOMS THAT SHOULD BE REPORTED IMMEDIATELY:  *FEVER GREATER THAN 100.5 F  *CHILLS WITH OR WITHOUT FEVER  NAUSEA AND VOMITING THAT IS NOT CONTROLLED WITH YOUR NAUSEA MEDICATION  *UNUSUAL SHORTNESS OF BREATH  *UNUSUAL BRUISING OR BLEEDING  TENDERNESS IN MOUTH AND THROAT WITH OR WITHOUT PRESENCE OF ULCERS  *URINARY PROBLEMS  *BOWEL PROBLEMS  UNUSUAL RASH Items with * indicate a potential emergency and should be followed up as soon as possible.  Feel free to call the clinic should you have any questions or concerns. The clinic phone number is [phone].  Please show the CHEMO ALERT CARD at check-in to the Emergency Department and triage nurse.   

## 2021-01-06 NOTE — Progress Notes (Signed)
Methotrexate dose of 22 mg is dosed by  MD. Demetrius Charity, PharmD

## 2021-01-07 ENCOUNTER — Inpatient Hospital Stay: Payer: BC Managed Care – PPO

## 2021-01-07 VITALS — BP 132/72 | HR 92 | Temp 98.8°F | Resp 18

## 2021-01-07 DIAGNOSIS — O019 Hydatidiform mole, unspecified: Secondary | ICD-10-CM

## 2021-01-07 DIAGNOSIS — Z5111 Encounter for antineoplastic chemotherapy: Secondary | ICD-10-CM | POA: Diagnosis not present

## 2021-01-07 MED ORDER — METHOTREXATE SODIUM (PF) CHEMO INJECTION 250 MG/10ML
22.0000 mg | Freq: Once | INTRAMUSCULAR | Status: AC
  Administered 2021-01-07: 22 mg via INTRAMUSCULAR
  Filled 2021-01-07: qty 0.88

## 2021-01-07 NOTE — Patient Instructions (Signed)
[facility] Discharge Instructions for Patients Receiving Chemotherapy  Today you received the following chemotherapy agents Methotrexate  To help prevent nausea and vomiting after your treatment, we encourage you to take your nausea medication as directed.   If you develop nausea and vomiting that is not controlled by your nausea medication, call the clinic.   BELOW ARE SYMPTOMS THAT SHOULD BE REPORTED IMMEDIATELY:  *FEVER GREATER THAN 100.5 F  *CHILLS WITH OR WITHOUT FEVER  NAUSEA AND VOMITING THAT IS NOT CONTROLLED WITH YOUR NAUSEA MEDICATION  *UNUSUAL SHORTNESS OF BREATH  *UNUSUAL BRUISING OR BLEEDING  TENDERNESS IN MOUTH AND THROAT WITH OR WITHOUT PRESENCE OF ULCERS  *URINARY PROBLEMS  *BOWEL PROBLEMS  UNUSUAL RASH Items with * indicate a potential emergency and should be followed up as soon as possible.  Feel free to call the clinic should you have any questions or concerns. The clinic phone number is [phone].  Please show the CHEMO ALERT CARD at check-in to the Emergency Department and triage nurse.   

## 2021-01-08 ENCOUNTER — Other Ambulatory Visit: Payer: Self-pay

## 2021-01-08 ENCOUNTER — Inpatient Hospital Stay: Payer: BC Managed Care – PPO

## 2021-01-08 VITALS — BP 145/85 | HR 98 | Temp 97.8°F | Resp 18

## 2021-01-08 DIAGNOSIS — Z5111 Encounter for antineoplastic chemotherapy: Secondary | ICD-10-CM | POA: Diagnosis not present

## 2021-01-08 DIAGNOSIS — O019 Hydatidiform mole, unspecified: Secondary | ICD-10-CM | POA: Diagnosis not present

## 2021-01-08 MED ORDER — METHOTREXATE SODIUM (PF) CHEMO INJECTION 250 MG/10ML
22.0000 mg | Freq: Once | INTRAMUSCULAR | Status: AC
  Administered 2021-01-08: 22 mg via INTRAMUSCULAR
  Filled 2021-01-08: qty 0.88

## 2021-01-08 NOTE — Patient Instructions (Addendum)
Methotrexate injection Qu es este medicamento? El METOTREXATO es un medicamento quimioteraputico. Stark Klein ciertos tipos de cncer. Algunos de los tipos de cncer tratados son cncer de mama, cncer de cabeza y cuello, leucemia, linfoma y osteosarcoma. Este medicamento tambin puede usarse para tratar la psoriasis y ciertos tipos de artritis. Este medicamento puede ser utilizado para otros usos; si tiene alguna pregunta consulte con su proveedor de atencin mdica o con su farmacutico. Qu le debo informar a mi profesional de la salud antes de tomar este medicamento? Necesitan saber si usted presenta alguno de los siguientes problemas o situaciones: lquido en el rea del estmago o los pulmones si bebe alcohol con frecuencia infeccin o problemas del sistema inmunolgico enfermedad renal enfermedad heptica recuentos sanguneos bajos (glbulos blancos, plaquetas o glbulos rojos) enfermedad pulmonar radiacin en curso o reciente vacuna reciente o programada lceras estomacales colitis ulcerativa una reaccin alrgica o inusual al metotrexato, a otros medicamentos, alimentos, colorantes o conservantes si est embarazada o buscando quedar embarazada si est amamantando a un beb Cmo debo utilizar este medicamento? Este medicamento se administra mediante infusin en una vena, o mediante inyeccin en un msculo o en el lquido cefalorraqudeo (lo que corresponda). Generalmente lo administra un profesional de Radiographer, therapeutic en un hospital o en un entorno clnico. En raras ocasiones, es posible que reciba este medicamento en su casa. Le ensearn cmo Engineer, materials. Use el medicamento exactamente como se le indique. Use su medicamento a intervalos regulares. No use su medicamento con una frecuencia mayor a la indicada. Si este medicamento se [place] para artritis o psoriasis, se debe usar semanalmente, NO diariamente. Es importante que deseche las agujas y las jeringas usadas en un recipiente  resistente a los pinchazos. No las deseche en la basura. Si no tiene un recipiente resistente a los pinchazos, llame a su farmacutico o proveedor de atencin de la salud para obtenerlo. Hable con su pediatra para informarse acerca del uso de este medicamento en nios. Aunque este medicamento se puede recetar a nios tan pequeos como de 2 aos de edad con ciertas afecciones, existen precauciones que deben tomarse. Sobredosis: Pngase en contacto inmediatamente con un centro toxicolgico o una sala de urgencia si usted cree que haya tomado demasiado medicamento. ATENCIN: [employer] es solo para usted. No comparta este medicamento con nadie. Qu sucede si me olvido de una dosis? Es importante no olvidar ninguna dosis. Informe a su mdico o a su profesional de la salud si no puede asistir a Marketing executive. Si usted se administra el frmaco y [place] dosis, hable con su mdico o profesional de [employer]. No se administre dosis adicionales o dobles. Qu puede interactuar con este medicamento? No use este medicamento con ninguno de los siguientes frmacos: acitretina Este medicamento tambin puede interactuar con los siguientes frmacos: aspirina o medicamentos tipo aspirina, incluso salicilatos azatioprina ciertos antibiticos, como cloranfenicol, penicilina, tetraciclina ciertos medicamentos que tratan o previenen cogulos sanguneos, tales como warfarina, apixabn, dabigatrn y rivaroxabn ciertos medicamentos para problemas estomacales, tales como esomeprazol, omeprazol, pantoprazol ciclosporina dapsona diurticos cido flico oro hidroxicloroquina vacunas de virus vivos medicamentos para las infecciones tales como aciclovir, adefovir, anfotericina B, bacitracina, cidofovir, foscarnet, ganciclovir, gentamicina, pentamidina, vancomicina mercaptopurina AINE, medicamentos para el dolor y la inflamacin, tales como ibuprofeno o naproxeno pamidronato pemetrexed penicilamina fenilbutazona fenitona  probenacida pirimetamina retinoides, tales como isotretinona y tretinona medicamentos esteroideos, tales como la prednisona o la cortisona sulfonamidas, tales como sulfasalazina y trimetoprima/sulfametoxasol teofilina cido zoledrnico Puede ser que esta lista no  menciona todas las posibles interacciones. Informe a su profesional de [employer] de [employer] productos a base de hierbas, medicamentos de [place] o suplementos nutritivos que est tomando. Si usted fuma, consume bebidas alcohlicas o si utiliza drogas ilegales, indqueselo tambin a su profesional de [employer]. Algunas sustancias pueden interactuar con su medicamento. A qu debo estar atento al usar [employer]? Este medicamento podra hacerle sentir un Risk analyst. Esto es normal, ya que la quimioterapia puede afectar tanto a las clulas sanas como a las clulas cancerosas. Si presenta algn efecto secundario, infrmelo. Contine con el tratamiento aun si se siente enfermo, a menos que su proveedor de atencin [employer] lo suspenda. Se supervisar su estado de salud atentamente mientras reciba este medicamento. Evite consumir bebidas alcohlicas. Este medicamento puede causar efectos secundarios graves. Para reducir Nurse, adult, su proveedor de atencin mdica puede darle otros medicamentos que deber usar antes de Regulatory affairs officer. Asegrese de seguir las instrucciones de su proveedor de Psychologist, prison and probation services. Este medicamento puede aumentar su sensibilidad al sol. Evite la [employer]. Si no la Network engineer, utilice ropa protectora y crema de Orthoptist. No utilice lmparas solares, camas solares ni cabinas solares. Puede experimentar somnolencia o mareos. No conduzca, no utilice maquinaria ni haga nada que Scientist, research (life sciences) en estado de alerta hasta que sepa cmo le afecta este medicamento. No se siente ni se ponga de pie con rapidez, especialmente si es un paciente de edad avanzada. Esto reduce el riesgo de mareos o  [employer]. Usted podra necesitar realizarse [employer] de sangre mientras est usando [employer]. Llame a su mdico o a su profesional de la salud si tiene fiebre, escalofros o dolor de garganta, o cualquier otro sntoma de resfro o gripe. No se trate usted mismo. Este medicamento reduce la capacidad del cuerpo para combatir infecciones. Trate de no acercarse a personas que estn enfermas. Este medicamento podra aumentar el riesgo de moretones o sangrado. Consulte a su mdico o a su profesional de la salud si observa sangrados inusuales. Proceda con cuidado al cepillar sus dientes, usar hilo dental o Chemical engineer palillos para los dientes, ya que podra contraer una infeccin o Geophysicist/field seismologist con mayor facilidad. Si recibe algn tratamiento dental, informe a su dentista que est [employer]. Consulte con su mdico o su profesional de la salud si tiene un ataque de diarrea grave, nuseas y vmitos, o sudoracin intensa. La prdida de demasiado lquido corporal puede hacer que sea peligroso usar [employer]. Consulte a su mdico acerca de su riesgo de cncer. Usted puede tener mayor riesgo para ciertos tipos de cncer si [place] este medicamento. No debe quedar embarazada mientras est usando este medicamento o por 6 meses despus de dejar de usarlo. Las mujeres deben informar a su proveedor de atencin mdica si estn buscando quedar embarazadas o si creen que podran estar embarazadas. Los hombres no deben Media planner a Careers information officer estn recibiendo [employer] y [place] 3 meses despus de dejar de usarlo. Existe la posibilidad de daos graves en un beb sin nacer. Para obtener ms informacin, hable con su proveedor de atencin mdica. No debe amamantar a un beb mientras est usando este medicamento o por 1 semana despus de dejar de usarlo. Este medicamento puede hacer ms difcil que una mujer quede embarazada o que un hombre embarace a [place]. Hable con su proveedor de atencin  mdica si le preocupa su fertilidad. Qu efectos secundarios puedo tener al [employer] este medicamento? Efectos secundarios  que debe informar a su mdico o a Producer, television/film/video de la salud tan pronto como sea posible: Therapist, art, tales como erupcin cutnea, comezn/picazn o urticaria, e hinchazn de la cara, los labios o la lengua dolor de espalda problemas respiratorios o falta de aire confusin diarrea tos seca, improductiva recuentos sanguneos bajos: este medicamento podra reducir la cantidad de glbulos blancos, glbulos rojos y plaquetas. Su riesgo de infeccin y sangrado puede ser mayor llagas en la boca enrojecimiento, formacin de ampollas, descamacin o distensin de la piel, incluso dentro de la boca convulsiones dolores de cabeza graves signos de infeccin: fiebre o escalofros, tos, Engineer, mining de Advertising copywriter, Engineer, mining o dificultad para Geographical information systems officer signos y sntomas de Landscape architect, tales como heces con sangre o de color negro y Water engineer alquitranado; [place] de color rojo o marrn oscuro; escupir sangre o material marrn que tiene el aspecto de posos (residuos) de caf; Regulatory affairs officer rojas en la piel; sangrado o moretones inusuales en los ojos, las encas o la nariz signos y sntomas de lesin renal, tales como dificultad para Geographical information systems officer o cambios en la cantidad de orina signos y sntomas de lesin en el hgado, tales como orina amarilla oscura o Child psychotherapist; sensacin general de estar enfermo o sntomas gripales; heces claras; prdida del apetito; nuseas; dolor en la regin abdominal superior derecha; debilidad o cansancio inusuales; color amarillento de los ojos o la piel cuello rgido vmito Efectos secundarios que generalmente no requieren atencin mdica (infrmelos a su mdico o a Producer, television/film/video de la salud si persisten o si son molestos): mareos cada del Water engineer de Academic librarian Puede ser que esta lista no menciona todos los posibles efectos secundarios. Comunquese a su mdico por  asesoramiento mdico [employer]. Usted puede informar los efectos secundarios a la FDA por telfono al 1-800-FDA-1088. Dnde debo guardar mi medicina? Este medicamento se administra en hospitales o clnicas. No se guardar en su casa. ATENCIN: Este folleto es un resumen. Puede ser que no cubra toda la posible informacin. Si usted tiene preguntas acerca de esta medicina, consulte con su mdico, su farmacutico o su profesional de Radiographer, therapeutic.  2021 Elsevier/Gold Standard (2020-08-26 00:00:00)

## 2021-01-21 ENCOUNTER — Ambulatory Visit (HOSPITAL_COMMUNITY)
Admission: RE | Admit: 2021-01-21 | Discharge: 2021-01-21 | Disposition: A | Discharge status: Home / Self Care | Payer: BC Managed Care – PPO | Source: Ambulatory Visit | Attending: Hematology and Oncology | Admitting: Hematology and Oncology

## 2021-01-21 ENCOUNTER — Other Ambulatory Visit: Payer: Self-pay

## 2021-01-21 DIAGNOSIS — C58 Malignant neoplasm of placenta: Secondary | ICD-10-CM | POA: Diagnosis not present

## 2021-01-21 DIAGNOSIS — O019 Hydatidiform mole, unspecified: Secondary | ICD-10-CM

## 2021-01-21 DIAGNOSIS — N858 Other specified noninflammatory disorders of uterus: Secondary | ICD-10-CM | POA: Diagnosis not present

## 2021-01-21 MED ORDER — GADOBUTROL 1 MMOL/ML IV SOLN
6.0000 mL | Freq: Once | INTRAVENOUS | Status: AC | PRN
  Administered 2021-01-21: 6 mL via INTRAVENOUS

## 2021-01-22 ENCOUNTER — Encounter: Payer: Self-pay | Admitting: Gynecologic Oncology

## 2021-01-26 ENCOUNTER — Inpatient Hospital Stay: Payer: BC Managed Care – PPO

## 2021-01-26 ENCOUNTER — Inpatient Hospital Stay: Payer: BC Managed Care – PPO | Attending: Gynecologic Oncology | Admitting: Gynecologic Oncology

## 2021-01-26 ENCOUNTER — Encounter: Payer: Self-pay | Admitting: Gynecologic Oncology

## 2021-01-26 ENCOUNTER — Other Ambulatory Visit: Payer: Self-pay

## 2021-01-26 VITALS — BP 137/75 | HR 115 | Temp 96.7°F | Resp 20 | Wt 139.0 lb

## 2021-01-26 DIAGNOSIS — O019 Hydatidiform mole, unspecified: Secondary | ICD-10-CM | POA: Diagnosis not present

## 2021-01-26 DIAGNOSIS — R002 Palpitations: Secondary | ICD-10-CM | POA: Diagnosis not present

## 2021-01-26 DIAGNOSIS — Z124 Encounter for screening for malignant neoplasm of cervix: Secondary | ICD-10-CM

## 2021-01-26 DIAGNOSIS — R000 Tachycardia, unspecified: Secondary | ICD-10-CM | POA: Insufficient documentation

## 2021-01-26 DIAGNOSIS — F419 Anxiety disorder, unspecified: Secondary | ICD-10-CM | POA: Insufficient documentation

## 2021-01-26 NOTE — Progress Notes (Signed)
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  She underwent D&C with final pathology confirming a molar pregnancy.  She was followed with beta hCG, somewhat sporadically, with values listed below.  She initially had significant decrease in her beta-hCG within nadir value just under 2000.  In early January, her beta-hCG was noted to be increasing again and most recently was 4433. Pelvic ultrasound at her Gyn is noted below.  10/09/20: Pelvic ultrasound exam - homogeneous retroflexed uterus , 1.1 x .7 x 1 cm complex cystic hypervascular area in the right fundal myometrium vs endometrium,EEC 4.8 mm,normal right ovary,hemorrhagic left ovarian cyst 2.1 x 2 x 1.2 cm,no free fluid,Dr Eure reviewed images during ultrasound.  10/12/20: CT C/A/P - Solid 5 mm nodule in the left lower lobe (series 4, image 98). There are additional scattered tiny pulmonary nodules which measure 2 mm or smaller for instance in the left upper lobe on series 4 image 35, image 42, and 62. No pleural effusion or pneumothorax. Uterine findings seen better on MRI. 10/12/20: Pelvic MRI - Uterus: Measures 9.3 x 3.9 x 6.4 cm (volume = 120 cm^3). A poorly defined area is seen in the right posterior fundal myometrium, which shows prominent internal vascularity and hyperenhancement. This measures approximately 2.4 x 2.4 x 2.3 cm, and is suspicious for invasive mole. There is no evidence of extension through the uterine serosa. No mass or fluid is seen within the endometrial cavity. Cervix and vagina are unremarkable. 10/16/20: MRI brain - negative for metastatic disease Last MTX on 4/15 01/21/21: Pelvic MRI - Uterus: Measures 8.6 by 3.7 x 5.8 cm (volume = 97 cm^3). Retroflexed. Previously seen hypervascular mass in the  right posterior fundal myometrium is no longer visualized. No other uterine masses are identified. Thin endometrium is seen measuring 3 mm in thickness. Normal appearance of cervix.  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  She is overall very well without any significant side effects.  She denies any mucositis, nausea, emesis, or other symptoms while receiving methotrexate.  She has continued to have normal monthly menses during treatment and is remember to take her oral birth control pills daily.  She is scheduled to start her next menstrual cycle at the end of this week.  Past Medical/Surgical History: Past Medical History:  Diagnosis Date  . Amenorrhea 03/25/2013  . Encounter for contraceptive management 03/07/2016  . Gestational trophoblastic neoplasm   . History of molar pregnancy   . Medical history non-contributory   . Patient desires pregnancy 04/15/2013  . Pregnant 06/15/2015  . Screening for depression 03/07/2016    Past Surgical History:  Procedure Laterality Date  . DILATION AND EVACUATION N/A 07/10/2020   Procedure: SUCTION DILATATION AND EVACUATION;  Surgeon: Lazaro Arms, MD;  Location: [facility] OR;  Service: Gynecology;  Laterality: N/A;    Family History  Problem Relation Age of Onset  . Cancer Mother 32       breast  . Asthma Neg Hx   . Diabetes  Neg Hx   . Heart disease Neg Hx   . Hypertension Neg Hx   . Stroke Neg Hx   . Colon cancer Neg Hx   . Ovarian cancer Neg Hx   . Endometrial cancer Neg Hx   . Pancreatic cancer Neg Hx   . Prostate cancer Neg Hx     Social History   Socioeconomic  History  . Marital status: Married    Spouse name: Not on file  . Number of children: Not on file  . Years of education: Not on file  . Highest education level: Not on file  Occupational History  . Occupation: home maker  Tobacco Use  . Smoking status: Never Smoker  . Smokeless tobacco: Never Used  Vaping Use  . Vaping Use: Never used  Substance and Sexual Activity  . Alcohol use: Not Currently    Comment: once, twice a year  . Drug use: No  . Sexual activity: Yes    Birth control/protection: Pill  Other Topics Concern  . Not on file  Social History Narrative  . Not on file   Social Determinants of Health   Financial Resource Strain: Not on file  Food Insecurity: Not on file  Transportation Needs: Not on file  Physical Activity: Not on file  Stress: Not on file  Social Connections: Not on file    Current Medications:  Current Outpatient Medications:  .  desogestrel-ethinyl estradiol (APRI) 0.15-30 MG-MCG tablet, Take 1 tablet by mouth daily., Disp: 28 tablet, Rfl: 11 .  folic acid (FOLVITE) 1 MG tablet, Take 1 tablet (1 mg total) by mouth daily., Disp: 30 tablet, Rfl: 11 .  Multiple Vitamin (MULTIVITAMIN PO), Take 1 tablet by mouth daily., Disp: , Rfl:  .  ondansetron (ZOFRAN) 8 MG tablet, Take 1 tablet (8 mg total) by mouth every 8 (eight) hours as needed (Nausea or vomiting). (Patient not taking: Reported on 01/22/2021), Disp: 30 tablet, Rfl: 1 .  prochlorperazine (COMPAZINE) 10 MG tablet, Take 1 tablet (10 mg total) by mouth every 6 (six) hours as needed (Nausea or vomiting). (Patient not taking: Reported on 01/22/2021), Disp: 30 tablet, Rfl: 1  Review of Systems: Denies appetite changes, fevers, chills, fatigue, unexplained weight changes. Denies hearing loss, neck lumps or masses, mouth sores, ringing in ears or voice changes. Denies cough or wheezing.  Denies shortness of breath. Denies chest pain or palpitations. Denies leg swelling. Denies abdominal distention,  pain, blood in stools, constipation, diarrhea, nausea, vomiting, or early satiety. Denies pain with intercourse, dysuria, frequency, hematuria or incontinence. Denies hot flashes, pelvic pain, vaginal bleeding or vaginal discharge.   Denies joint pain, back pain or muscle pain/cramps. Denies itching, rash, or wounds. Denies dizziness, headaches, numbness or seizures. Denies swollen lymph nodes or glands, denies easy bruising or bleeding. Denies anxiety, depression, confusion, or decreased concentration.  Physical Exam: BP 137/75 (BP Location: Right Arm, Patient Position: Sitting)   Pulse (!) 115   Temp (!) 96.7 F (35.9 C) (Tympanic)   Resp 20   Wt 139 lb (63 kg)   SpO2 100% Comment: RA  BMI 26.26 kg/m  General: Alert, oriented, no acute distress. HEENT: Posterior oropharynx clear, sclera anicteric. Chest: Clear to auscultation bilaterally.   Cardiovascular: Regular rhythm, tachycardic in the 120s on auscultation, no murmurs. Abdomen: soft, nontender.  Normoactive bowel sounds.  No masses or hepatosplenomegaly appreciated.   Extremities: Grossly normal range of motion.  Warm, well perfused.  No edema bilaterally. GU: Normal appearing external genitalia without  erythema, excoriation, or lesions.  Speculum exam reveals normal-appearing cervix with ectropion noted.  Pap test and HPV testing collected.  Laboratory & Radiologic Studies: None new  Assessment & Plan: Maria [name] is a 30 y.o. woman with FIGO stage I, WHO score 2 GTN who presents for follow-up after completing single agent methotrexate chemotherapy.  Discussed in detail with the patient as well as her partner that her hCG normalized very quickly on single agent chemotherapy.  She received an additional 2 cycles after hCG had normalized.  Her pelvic MRI shows complete resolution of the intrauterine pathology concerning for GTN when she initially presented in January.  Given this finding as well as normalization of her hCG, I  do not think that any surgical intervention is indicated. The patient very much continues to wish to preserve fertility if possible.  In terms of follow-up, her last chemotherapy was 4/15.  I think she can stop her folic acid.  I recommend hcg q 2 weeks until 3 months and then monthly until 1year post treatment.  Patient understands this and her preference is to do this closer to home in [place].  I will reach out to her gynecologist Dr. Despina Hidden about having her blood work done at his office.  We discussed the importance of continued birth control until she is a year out from completion of treatment.  She asked about other options besides OCPs.  Given resolution of intrauterine findings, I think a method such as an intrauterine device would now be safe if she still wanted.  She does not endorse any difficulty remembering to take birth control pills or having significant side effects on them.  I have asked her to think about this and to let me or Dr. Benjie Karvonen know if she would like to change birth control.  I would recommend against something like Depo-Provera as this can cause some delay to return to fertility once stopped.  Patient was due for cervical cancer screening today.  Pap and HPV testing were performed.  She knows that I will call her with these results.  The patient is intermittently tachycardic when she presents for care.  She voices that this is related to anxiety and that she can feel palpitations.  Her heart is regular on auscultation and her heart rate and blood pressure came down significantly after we had reviewed her recent MRI results.  She is otherwise asymptomatic.  38 minutes of total time was spent for this patient encounter, including preparation, face-to-face counseling with the patient and coordination of care, and documentation of the encounter.  Eugene Garnet, MD  Division of Gynecologic Oncology  Department of Obstetrics and Gynecology  [facility]

## 2021-01-26 NOTE — Patient Instructions (Signed)
Gusto verle. Todo parece normal en su MRI (de la matrice). Su nivel de hormana del Psychiatrist tambien es normal.  Vamos a Occupational psychologist laboratorio cada 2 semanas hasta medio [place] y depues cada mes Elaina Hoops April 2023.  Por favor, llame si tiene algun nueva symptoma. [phone].

## 2021-01-27 ENCOUNTER — Other Ambulatory Visit (HOSPITAL_COMMUNITY)
Admission: RE | Admit: 2021-01-27 | Discharge: 2021-01-27 | Disposition: A | Discharge status: Home / Self Care | Payer: BC Managed Care – PPO | Source: Ambulatory Visit | Attending: Gynecologic Oncology | Admitting: Gynecologic Oncology

## 2021-01-27 DIAGNOSIS — Z124 Encounter for screening for malignant neoplasm of cervix: Secondary | ICD-10-CM | POA: Insufficient documentation

## 2021-01-27 LAB — BETA HCG QUANT (REF LAB): hCG Quant: <1 m[IU]/mL

## 2021-01-27 NOTE — Addendum Note (Signed)
Addended by: Lorine Bears on: 01/27/2021 09:37 AM   Modules accepted: Orders

## 2021-01-29 LAB — CYTOLOGY - PAP
Comment: NEGATIVE
Diagnosis: NEGATIVE
High risk HPV: NEGATIVE

## 2021-02-02 ENCOUNTER — Telehealth: Payer: Self-pay | Admitting: Oncology

## 2021-02-02 NOTE — Telephone Encounter (Signed)
Attempted to call PAP smear results with the assistance of [employer].  Will try again at a later time.

## 2021-02-03 NOTE — Telephone Encounter (Signed)
Raynelle Fanning, Medical Interpreter will call patient today with negative PAP smear/HPV results.

## 2021-02-08 ENCOUNTER — Other Ambulatory Visit: Payer: BC Managed Care – PPO

## 2021-02-08 ENCOUNTER — Other Ambulatory Visit: Payer: Self-pay

## 2021-02-08 DIAGNOSIS — Z8759 Personal history of other complications of pregnancy, childbirth and the puerperium: Secondary | ICD-10-CM | POA: Diagnosis not present

## 2021-02-09 LAB — BETA HCG QUANT (REF LAB): hCG Quant: <1 m[IU]/mL

## 2021-02-18 NOTE — Progress Notes (Signed)
awesome

## 2021-02-24 ENCOUNTER — Other Ambulatory Visit: Payer: BC Managed Care – PPO

## 2021-02-25 ENCOUNTER — Other Ambulatory Visit: Payer: BC Managed Care – PPO

## 2021-02-25 DIAGNOSIS — O019 Hydatidiform mole, unspecified: Secondary | ICD-10-CM | POA: Diagnosis not present

## 2021-02-26 LAB — BETA HCG QUANT (REF LAB): hCG Quant: <1 m[IU]/mL

## 2021-03-10 ENCOUNTER — Other Ambulatory Visit: Payer: BC Managed Care – PPO

## 2021-03-10 DIAGNOSIS — O019 Hydatidiform mole, unspecified: Secondary | ICD-10-CM | POA: Diagnosis not present

## 2021-03-11 LAB — BETA HCG QUANT (REF LAB): hCG Quant: <1 m[IU]/mL

## 2021-03-24 ENCOUNTER — Other Ambulatory Visit: Payer: BC Managed Care – PPO

## 2021-03-24 DIAGNOSIS — O019 Hydatidiform mole, unspecified: Secondary | ICD-10-CM | POA: Diagnosis not present

## 2021-03-25 LAB — BETA HCG QUANT (REF LAB): hCG Quant: <1 m[IU]/mL

## 2021-03-26 DIAGNOSIS — E663 Overweight: Secondary | ICD-10-CM | POA: Diagnosis not present

## 2021-03-26 DIAGNOSIS — Z1331 Encounter for screening for depression: Secondary | ICD-10-CM | POA: Diagnosis not present

## 2021-03-26 DIAGNOSIS — Z6825 Body mass index (BMI) 25.0-25.9, adult: Secondary | ICD-10-CM | POA: Diagnosis not present

## 2021-03-26 DIAGNOSIS — Z0001 Encounter for general adult medical examination with abnormal findings: Secondary | ICD-10-CM | POA: Diagnosis not present

## 2021-04-07 ENCOUNTER — Other Ambulatory Visit: Payer: BC Managed Care – PPO

## 2021-04-20 ENCOUNTER — Telehealth: Payer: Self-pay | Admitting: Obstetrics & Gynecology

## 2021-04-20 NOTE — Telephone Encounter (Signed)
Patient called stating that she is needing a refill of her medication, Patient states that her pharmacy states she only has one month left and she should give [place] a call to request the next refills for three months. Please contact pt when medication has been sent to pharmacy. She uses [facility] on [address]

## 2021-04-21 ENCOUNTER — Other Ambulatory Visit: Payer: BC Managed Care – PPO

## 2021-04-21 ENCOUNTER — Other Ambulatory Visit: Payer: Self-pay

## 2021-04-21 DIAGNOSIS — Z8759 Personal history of other complications of pregnancy, childbirth and the puerperium: Secondary | ICD-10-CM | POA: Diagnosis not present

## 2021-04-22 LAB — BETA HCG QUANT (REF LAB): hCG Quant: <1 m[IU]/mL

## 2021-05-05 ENCOUNTER — Other Ambulatory Visit: Payer: BC Managed Care – PPO

## 2021-05-19 ENCOUNTER — Other Ambulatory Visit: Payer: BC Managed Care – PPO

## 2021-05-24 ENCOUNTER — Other Ambulatory Visit: Payer: BC Managed Care – PPO

## 2021-05-24 ENCOUNTER — Other Ambulatory Visit: Payer: Self-pay

## 2021-05-24 DIAGNOSIS — Z8759 Personal history of other complications of pregnancy, childbirth and the puerperium: Secondary | ICD-10-CM | POA: Diagnosis not present

## 2021-05-24 MED ORDER — DESOGESTREL-ETHINYL ESTRADIOL 0.15-30 MG-MCG PO TABS: 1.0000 | ORAL_TABLET | Freq: Every day | ORAL | 11 refills | Status: DC

## 2021-05-25 ENCOUNTER — Other Ambulatory Visit (HOSPITAL_COMMUNITY): Payer: Self-pay | Admitting: Obstetrics & Gynecology

## 2021-05-25 DIAGNOSIS — Z1231 Encounter for screening mammogram for malignant neoplasm of breast: Secondary | ICD-10-CM

## 2021-05-25 LAB — BETA HCG QUANT (REF LAB): hCG Quant: <1 m[IU]/mL

## 2021-05-28 ENCOUNTER — Other Ambulatory Visit: Payer: Self-pay | Admitting: Obstetrics & Gynecology

## 2021-06-07 ENCOUNTER — Ambulatory Visit (HOSPITAL_COMMUNITY)
Admission: RE | Admit: 2021-06-07 | Discharge: 2021-06-07 | Disposition: A | Discharge status: Home / Self Care | Payer: BC Managed Care – PPO | Source: Ambulatory Visit | Attending: Obstetrics & Gynecology | Admitting: Obstetrics & Gynecology

## 2021-06-07 ENCOUNTER — Other Ambulatory Visit: Payer: Self-pay

## 2021-06-07 DIAGNOSIS — Z1231 Encounter for screening mammogram for malignant neoplasm of breast: Secondary | ICD-10-CM | POA: Insufficient documentation

## 2021-06-17 ENCOUNTER — Other Ambulatory Visit: Payer: BC Managed Care – PPO

## 2021-06-17 DIAGNOSIS — Z8759 Personal history of other complications of pregnancy, childbirth and the puerperium: Secondary | ICD-10-CM | POA: Diagnosis not present

## 2021-06-18 LAB — BETA HCG QUANT (REF LAB): hCG Quant: <1 m[IU]/mL

## 2021-07-16 ENCOUNTER — Other Ambulatory Visit: Payer: BC Managed Care – PPO

## 2021-07-16 DIAGNOSIS — Z8759 Personal history of other complications of pregnancy, childbirth and the puerperium: Secondary | ICD-10-CM | POA: Diagnosis not present

## 2021-07-17 LAB — BETA HCG QUANT (REF LAB): hCG Quant: <1 m[IU]/mL

## 2021-07-30 ENCOUNTER — Encounter: Payer: Self-pay | Admitting: Hematology and Oncology

## 2021-08-09 DIAGNOSIS — Z23 Encounter for immunization: Secondary | ICD-10-CM | POA: Diagnosis not present

## 2021-08-16 ENCOUNTER — Other Ambulatory Visit: Payer: BC Managed Care – PPO

## 2021-08-16 DIAGNOSIS — Z8759 Personal history of other complications of pregnancy, childbirth and the puerperium: Secondary | ICD-10-CM

## 2021-08-17 LAB — BETA HCG QUANT (REF LAB): hCG Quant: <1 m[IU]/mL

## 2021-08-18 DIAGNOSIS — J029 Acute pharyngitis, unspecified: Secondary | ICD-10-CM | POA: Diagnosis not present

## 2021-09-15 ENCOUNTER — Other Ambulatory Visit: Payer: BC Managed Care – PPO

## 2021-09-15 DIAGNOSIS — Z8759 Personal history of other complications of pregnancy, childbirth and the puerperium: Secondary | ICD-10-CM | POA: Diagnosis not present

## 2021-09-15 DIAGNOSIS — O019 Hydatidiform mole, unspecified: Secondary | ICD-10-CM | POA: Diagnosis not present

## 2021-09-16 LAB — BETA HCG QUANT (REF LAB): hCG Quant: <1 m[IU]/mL

## 2021-10-15 ENCOUNTER — Other Ambulatory Visit: Payer: Self-pay

## 2021-10-15 ENCOUNTER — Other Ambulatory Visit: Payer: BC Managed Care – PPO

## 2021-10-15 DIAGNOSIS — O019 Hydatidiform mole, unspecified: Secondary | ICD-10-CM

## 2021-10-15 DIAGNOSIS — Z8759 Personal history of other complications of pregnancy, childbirth and the puerperium: Secondary | ICD-10-CM | POA: Diagnosis not present

## 2021-10-16 LAB — BETA HCG QUANT (REF LAB): hCG Quant: <1 m[IU]/mL

## 2021-11-12 ENCOUNTER — Other Ambulatory Visit: Payer: BC Managed Care – PPO

## 2021-11-12 ENCOUNTER — Other Ambulatory Visit: Payer: Self-pay

## 2021-11-12 DIAGNOSIS — O019 Hydatidiform mole, unspecified: Secondary | ICD-10-CM | POA: Diagnosis not present

## 2021-11-12 DIAGNOSIS — Z8759 Personal history of other complications of pregnancy, childbirth and the puerperium: Secondary | ICD-10-CM

## 2021-11-13 LAB — BETA HCG QUANT (REF LAB): hCG Quant: <1 m[IU]/mL

## 2021-12-10 ENCOUNTER — Other Ambulatory Visit: Payer: BC Managed Care – PPO

## 2021-12-10 DIAGNOSIS — Z8759 Personal history of other complications of pregnancy, childbirth and the puerperium: Secondary | ICD-10-CM | POA: Diagnosis not present

## 2021-12-10 DIAGNOSIS — O019 Hydatidiform mole, unspecified: Secondary | ICD-10-CM | POA: Diagnosis not present

## 2021-12-11 LAB — BETA HCG QUANT (REF LAB): hCG Quant: <1 m[IU]/mL

## 2022-01-11 ENCOUNTER — Other Ambulatory Visit: Payer: BC Managed Care – PPO

## 2022-01-11 DIAGNOSIS — O019 Hydatidiform mole, unspecified: Secondary | ICD-10-CM | POA: Diagnosis not present

## 2022-01-12 LAB — BETA HCG QUANT (REF LAB): hCG Quant: <1 m[IU]/mL

## 2022-01-25 ENCOUNTER — Telehealth: Payer: Self-pay | Admitting: Obstetrics & Gynecology

## 2022-01-25 NOTE — Telephone Encounter (Signed)
Patient called stating that she would like to know if she is done doing the blood work, and what will be the next step.Pt speaks spanish,  ?

## 2022-04-26 ENCOUNTER — Telehealth: Payer: Self-pay | Admitting: *Deleted

## 2022-04-26 NOTE — Chronic Care Management (AMB) (Signed)
     Care Coordination   Note   04/26/2022 Name: Maria [name] MRN: [id number] DOB: [birth year]  Maria [name] is a 31 y.o. year old female who sees Pllc, Social research officer, government for primary care. I reached out to [facility] by phone today using [employer] [phone]  named Daleen Bo to offer care coordination services.  Maria [name] was given information about Care Coordination services today including:   The Care Coordination services include support from the care team which includes your Nurse Coordinator, Clinical Social Worker, or Pharmacist.  The Care Coordination team is here to help remove barriers to the health concerns and goals most important to you. Care Coordination services are voluntary, and the patient may decline or stop services at any time by request to their care team member.   Care Coordination Consent Status: Patient agreed to services and verbal consent obtained.   Follow up plan:  Telephone appointment with care coordination team member scheduled for:  05/02/22  Encounter Outcome:  Pt. Scheduled  [facility] Coordination Care Guide  Direct Dial: [phone]

## 2022-04-26 NOTE — Chronic Care Management (AMB) (Signed)
  Care Coordination  Outreach Note  04/26/2022 Name: Maria [name] MRN: [id number] DOB: [birth year]   Care Coordination Outreach Attempts  An unsuccessful telephone outreach was attempted today to offer the patient information about available care coordination services as a benefit of their health plan.  [employer] was used CB#[id number] named Civil engineer, contracting.   Follow Up Plan:  Additional outreach attempts will be made to offer the patient care coordination information and services.   Encounter Outcome:  No Answer  Gwenevere Ghazi  Care Coordination Care Guide  Direct Dial: [phone]

## 2022-04-28 ENCOUNTER — Encounter: Payer: Self-pay | Admitting: Obstetrics & Gynecology

## 2022-04-28 ENCOUNTER — Ambulatory Visit (INDEPENDENT_AMBULATORY_CARE_PROVIDER_SITE_OTHER): Payer: BC Managed Care – PPO | Admitting: Obstetrics & Gynecology

## 2022-04-28 ENCOUNTER — Other Ambulatory Visit (HOSPITAL_COMMUNITY)
Admission: RE | Admit: 2022-04-28 | Discharge: 2022-04-28 | Disposition: A | Discharge status: Home / Self Care | Payer: BC Managed Care – PPO | Source: Ambulatory Visit | Attending: Obstetrics & Gynecology | Admitting: Obstetrics & Gynecology

## 2022-04-28 VITALS — BP 136/82 | HR 105 | Ht 63.0 in | Wt 134.0 lb

## 2022-04-28 DIAGNOSIS — Z8759 Personal history of other complications of pregnancy, childbirth and the puerperium: Secondary | ICD-10-CM | POA: Diagnosis not present

## 2022-04-28 DIAGNOSIS — Z01419 Encounter for gynecological examination (general) (routine) without abnormal findings: Secondary | ICD-10-CM

## 2022-04-28 MED ORDER — DESOGESTREL-ETHINYL ESTRADIOL 0.15-30 MG-MCG PO TABS: 1.0000 | ORAL_TABLET | Freq: Every day | ORAL | 11 refills | Status: DC

## 2022-04-28 NOTE — Progress Notes (Signed)
Subjective:     Maria [name] is a 31 y.o. female here for a routine exam.  Patient's last menstrual period was 04/20/2022. G3P1001 Birth Control Method:  COC Menstrual Calendar(currently):   Current complaints: none.   Current acute medical issues:  perisitent GTD=GTN treated with methotrexate   Recent Gynecologic History Patient's last menstrual period was 04/20/2022. Last Pap: 2022,   Last mammogram:   na  Past Medical History:  Diagnosis Date   Amenorrhea 03/25/2013   Encounter for contraceptive management 03/07/2016   Gestational trophoblastic neoplasm    History of molar pregnancy    Medical history non-contributory    Patient desires pregnancy 04/15/2013   Pregnant 06/15/2015   Screening for depression 03/07/2016    Past Surgical History:  Procedure Laterality Date   DILATION AND EVACUATION N/A 07/10/2020   Procedure: SUCTION DILATATION AND EVACUATION;  Surgeon: Lazaro Arms, MD;  Location: [facility] OR;  Service: Gynecology;  Laterality: N/A;    OB History     Gravida  3   Para  1   Term  1   Preterm      AB      Living  1      SAB      IAB      Ectopic      Multiple  0   Live Births  1           Social History   Socioeconomic History   Marital status: Married    Spouse name: Not on file   Number of children: Not on file   Years of education: Not on file   Highest education level: Not on file  Occupational History   Occupation: home maker  Tobacco Use   Smoking status: Never   Smokeless tobacco: Never  Vaping Use   Vaping Use: Never used  Substance and Sexual Activity   Alcohol use: Not Currently    Comment: once, twice a year   Drug use: No   Sexual activity: Yes    Birth control/protection: Pill  Other Topics Concern   Not on file  Social History Narrative   Not on file   Social Determinants of Health   Financial Resource Strain: Low Risk  (04/28/2022)   Overall Financial Resource Strain (CARDIA)    Difficulty of Paying Living  Expenses: Not very hard  Food Insecurity: No Food Insecurity (04/28/2022)   Hunger Vital Sign    Worried About Running Out of Food in the Last Year: Never true    Ran Out of Food in the Last Year: Never true  Transportation Needs: No Transportation Needs (04/28/2022)   PRAPARE - Administrator, Civil Service (Medical): No    Lack of Transportation (Non-Medical): No  Physical Activity: Insufficiently Active (04/28/2022)   Exercise Vital Sign    Days of Exercise per Week: 3 days    Minutes of Exercise per Session: 30 min  Stress: No Stress Concern Present (04/28/2022)   [employer] of Occupational Health - Occupational Stress Questionnaire    Feeling of Stress : Not at all  Social Connections: Socially Integrated (04/28/2022)   Social Connection and Isolation Panel [NHANES]    Frequency of Communication with Friends and Family: Three times a week    Frequency of Social Gatherings with Friends and Family: Twice a week    Attends Religious Services: More than 4 times per year    Active Member of [employer] or Organizations: Yes    Attends [employer]  or Organization Meetings: More than 4 times per year    Marital Status: Married    Family History  Problem Relation Age of Onset   Cancer Mother 78       breast   Asthma Neg Hx    Diabetes Neg Hx    Heart disease Neg Hx    Hypertension Neg Hx    Stroke Neg Hx    Colon cancer Neg Hx    Ovarian cancer Neg Hx    Endometrial cancer Neg Hx    Pancreatic cancer Neg Hx    Prostate cancer Neg Hx      Current Outpatient Medications:    Multiple Vitamin (MULTIVITAMIN PO), Take 1 tablet by mouth daily., Disp: , Rfl:    desogestrel-ethinyl estradiol (ISIBLOOM) 0.15-30 MG-MCG tablet, Take 1 tablet by mouth daily., Disp: 28 tablet, Rfl: 11   folic acid (FOLVITE) 1 MG tablet, Take 1 tablet (1 mg total) by mouth daily. (Patient not taking: Reported on 04/28/2022), Disp: 30 tablet, Rfl: 11   ondansetron (ZOFRAN) 8 MG tablet, Take 1 tablet (8 mg total)  by mouth every 8 (eight) hours as needed (Nausea or vomiting). (Patient not taking: Reported on 01/22/2021), Disp: 30 tablet, Rfl: 1   prochlorperazine (COMPAZINE) 10 MG tablet, Take 1 tablet (10 mg total) by mouth every 6 (six) hours as needed (Nausea or vomiting). (Patient not taking: Reported on 01/22/2021), Disp: 30 tablet, Rfl: 1  Review of Systems  Review of Systems  Constitutional: Negative for fever, chills, weight loss, malaise/fatigue and diaphoresis.  HENT: Negative for hearing loss, ear pain, nosebleeds, congestion, sore throat, neck pain, tinnitus and ear discharge.   Eyes: Negative for blurred vision, double vision, photophobia, pain, discharge and redness.  Respiratory: Negative for cough, hemoptysis, sputum production, shortness of breath, wheezing and stridor.   Cardiovascular: Negative for chest pain, palpitations, orthopnea, claudication, leg swelling and PND.  Gastrointestinal: negative for abdominal pain. Negative for heartburn, nausea, vomiting, diarrhea, constipation, blood in stool and melena.  Genitourinary: Negative for dysuria, urgency, frequency, hematuria and flank pain.  Musculoskeletal: Negative for myalgias, back pain, joint pain and falls.  Skin: Negative for itching and rash.  Neurological: Negative for dizziness, tingling, tremors, sensory change, speech change, focal weakness, seizures, loss of consciousness, weakness and headaches.  Endo/Heme/Allergies: Negative for environmental allergies and polydipsia. Does not bruise/bleed easily.  Psychiatric/Behavioral: Negative for depression, suicidal ideas, hallucinations, memory loss and substance abuse. The patient is not nervous/anxious and does not have insomnia.        Objective:  Blood pressure 136/82, pulse (!) 105, height 5\' 3"  (1.6 m), weight 134 lb (60.8 kg), last menstrual period 04/20/2022, unknown if currently breastfeeding.   Physical Exam  Vitals reviewed. Constitutional: She is oriented to person,  place, and time. She appears well-developed and well-nourished.  HENT:  Head: Normocephalic and atraumatic.        Right Ear: External ear normal.  Left Ear: External ear normal.  Nose: Nose normal.  Mouth/Throat: Oropharynx is clear and moist.  Eyes: Conjunctivae and EOM are normal. Pupils are equal, round, and reactive to light. Right eye exhibits no discharge. Left eye exhibits no discharge. No scleral icterus.  Neck: Normal range of motion. Neck supple. No tracheal deviation present. No thyromegaly present.  Cardiovascular: Normal rate, regular rhythm, normal heart sounds and intact distal pulses.  Exam reveals no gallop and no friction rub.   No murmur heard. Respiratory: Effort normal and breath sounds normal. No respiratory distress. She has  no wheezes. She has no rales. She exhibits no tenderness.  GI: Soft. Bowel sounds are normal. She exhibits no distension and no mass. There is no tenderness. There is no rebound and no guarding.  Genitourinary:  Breasts no masses skin changes or nipple changes bilaterally      Vulva is normal without lesions Vagina is pink moist without discharge Cervix normal in appearance and pap is done Uterus is normal size shape and contour Adnexa is negative with normal sized ovaries   Musculoskeletal: Normal range of motion. She exhibits no edema and no tenderness.  Neurological: She is alert and oriented to person, place, and time. She has normal reflexes. She displays normal reflexes. No cranial nerve deficit. She exhibits normal muscle tone. Coordination normal.  Skin: Skin is warm and dry. No rash noted. No erythema. No pallor.  Psychiatric: She has a normal mood and affect. Her behavior is normal. Judgment and thought content normal.       Medications Ordered at today's visit: Meds ordered this encounter  Medications   desogestrel-ethinyl estradiol (ISIBLOOM) 0.15-30 MG-MCG tablet    Sig: Take 1 tablet by mouth daily.    Dispense:  28 tablet     Refill:  11    Other orders placed at today's visit: No orders of the defined types were placed in this encounter.     Assessment:    Normal Gyn exam.      ICD-10-CM   1. Well woman exam with routine gynecological exam  Z01.419     2. History of molar pregnancy  Z87.59       Plan:    Contraception: OCP (estrogen/progesterone). Follow up in: 1 year.     Return in about 1 year (around 04/29/2023), or if symptoms worsen or fail to improve.

## 2022-04-28 NOTE — Addendum Note (Signed)
Addended by: Harrel Lemon on: 04/28/2022 05:02 PM   Modules accepted: Orders

## 2022-05-02 ENCOUNTER — Encounter: Payer: Self-pay | Admitting: *Deleted

## 2022-05-02 ENCOUNTER — Ambulatory Visit: Payer: Self-pay | Admitting: *Deleted

## 2022-05-02 NOTE — Patient Outreach (Signed)
  Care Coordination   Initial Visit Note   05/02/2022 Name: Jalaine Riggenbach MRN: [id number] DOB: 02-22-1991  Brae Schaafsma is a 31 y.o. year old female who sees Pllc, Social research officer, government for primary care. I spoke with  Virgina Evener by phone today  What matters to the patients health and wellness today?  No concerns, wishes to remain stable.  No chronic medical conditions, only active prescribed medications is birth control.     SDOH assessments and interventions completed:  Yes     Care Coordination Interventions Activated:  Yes  Care Coordination Interventions:  Yes, provided  Discussed need for AWV, has yearly physical scheduled for 8/11.  Follow up plan: No further intervention required.   Encounter Outcome:  Pt. Visit Completed   Kemper Durie, RN, MSN, [facility] Care Coordinator [phone]

## 2022-05-03 LAB — CYTOLOGY - PAP
Comment: NEGATIVE
Diagnosis: NEGATIVE
Diagnosis: REACTIVE
High risk HPV: NEGATIVE

## 2022-05-06 DIAGNOSIS — J0110 Acute frontal sinusitis, unspecified: Secondary | ICD-10-CM | POA: Diagnosis not present

## 2022-05-06 DIAGNOSIS — Z6825 Body mass index (BMI) 25.0-25.9, adult: Secondary | ICD-10-CM | POA: Diagnosis not present

## 2022-05-06 DIAGNOSIS — Z0001 Encounter for general adult medical examination with abnormal findings: Secondary | ICD-10-CM | POA: Diagnosis not present

## 2022-05-06 DIAGNOSIS — J302 Other seasonal allergic rhinitis: Secondary | ICD-10-CM | POA: Diagnosis not present

## 2022-05-06 DIAGNOSIS — Z1331 Encounter for screening for depression: Secondary | ICD-10-CM | POA: Diagnosis not present

## 2022-08-04 DIAGNOSIS — Z23 Encounter for immunization: Secondary | ICD-10-CM | POA: Diagnosis not present

## 2022-12-08 ENCOUNTER — Other Ambulatory Visit (HOSPITAL_COMMUNITY): Payer: Self-pay | Admitting: Obstetrics & Gynecology

## 2022-12-08 DIAGNOSIS — Z1231 Encounter for screening mammogram for malignant neoplasm of breast: Secondary | ICD-10-CM

## 2022-12-12 ENCOUNTER — Ambulatory Visit (HOSPITAL_COMMUNITY)
Admission: RE | Admit: 2022-12-12 | Discharge: 2022-12-12 | Disposition: A | Discharge status: Home / Self Care | Payer: BC Managed Care – PPO | Source: Ambulatory Visit | Attending: Obstetrics & Gynecology | Admitting: Obstetrics & Gynecology

## 2022-12-12 DIAGNOSIS — Z1231 Encounter for screening mammogram for malignant neoplasm of breast: Secondary | ICD-10-CM | POA: Insufficient documentation

## 2023-04-12 ENCOUNTER — Other Ambulatory Visit: Payer: Self-pay | Admitting: Obstetrics & Gynecology

## 2023-04-23 IMAGING — MG MM DIGITAL SCREENING BILAT W/ TOMO AND CAD
8 series · 9 of 24 positions shown · non-contrast
Comparison: Previous exam(s).

CLINICAL DATA: Screening.

EXAM:
DIGITAL SCREENING BILATERAL MAMMOGRAM WITH TOMOSYNTHESIS AND CAD
TECHNIQUE: Bilateral screening digital craniocaudal and mediolateral oblique
mammograms were obtained. Bilateral screening digital breast
tomosynthesis was performed. The images were evaluated with
computer-aided detection.

[R MLO synth-2D]
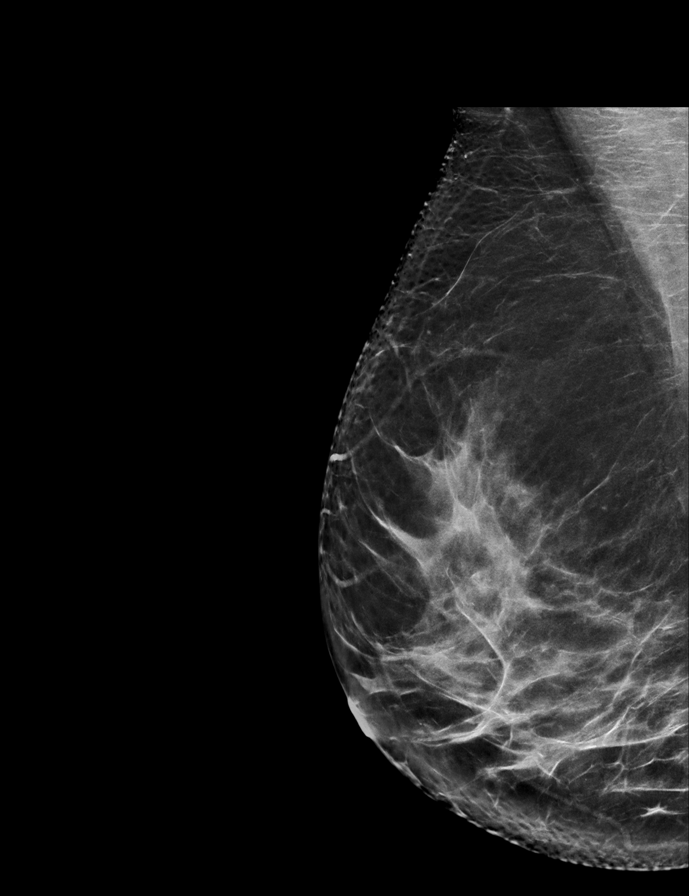

[L CC synth-2D]
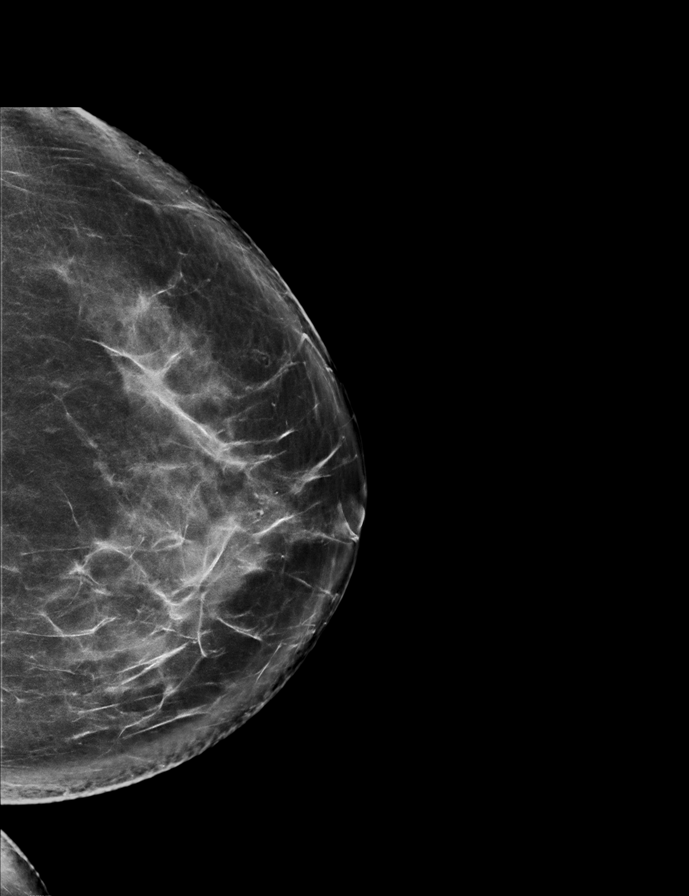

[R CC synth-2D]
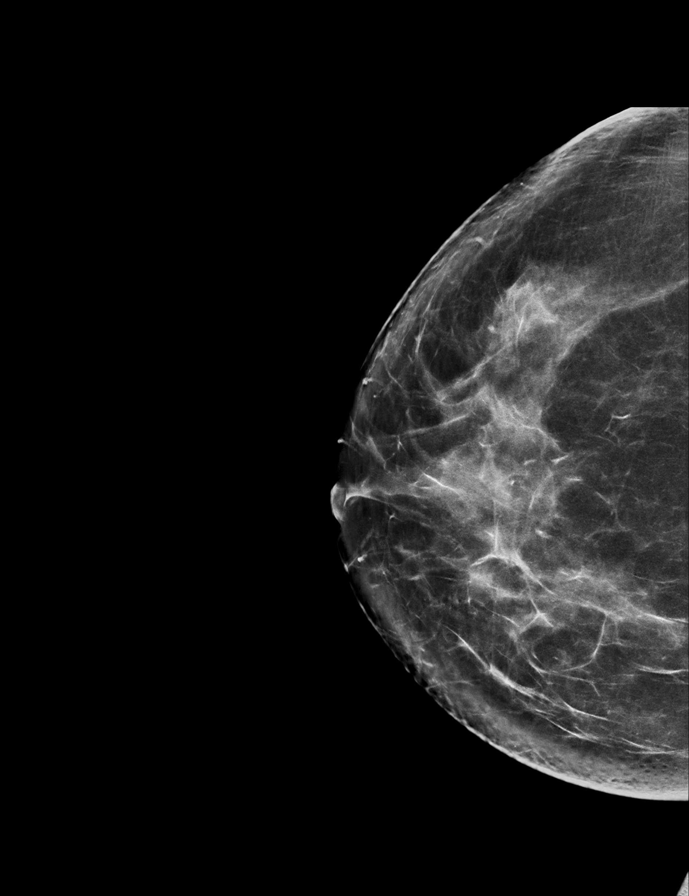

[L MLO synth-2D]
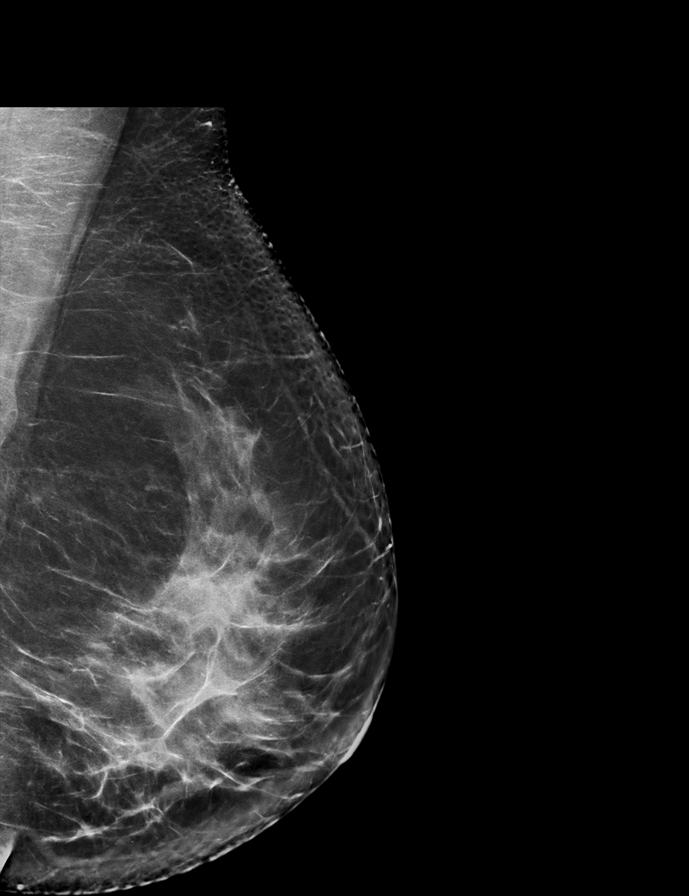

[L CC tomo · 2 of 87 frames shown]
[frame 29/87]
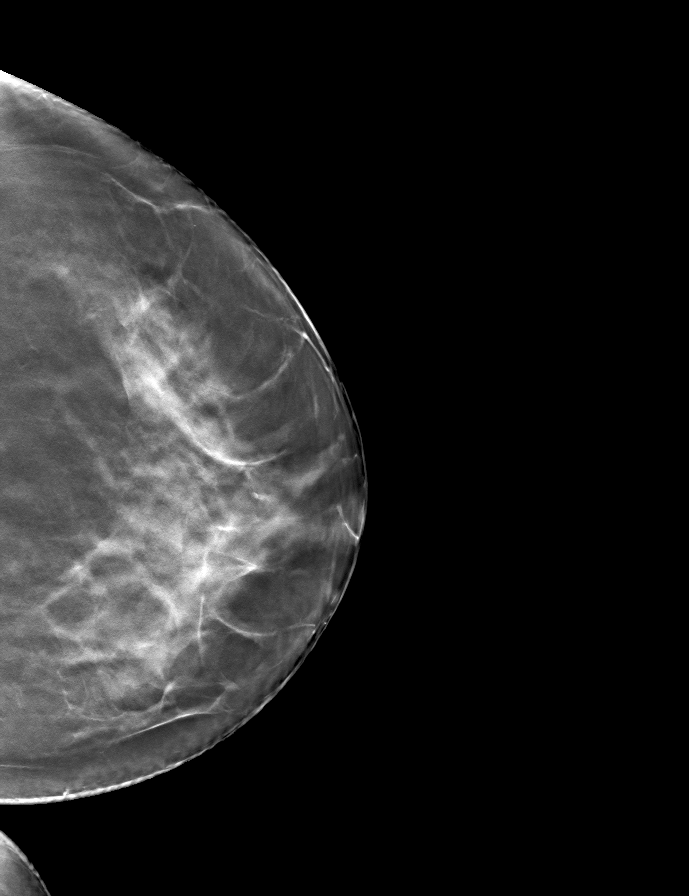
[frame 44/87]
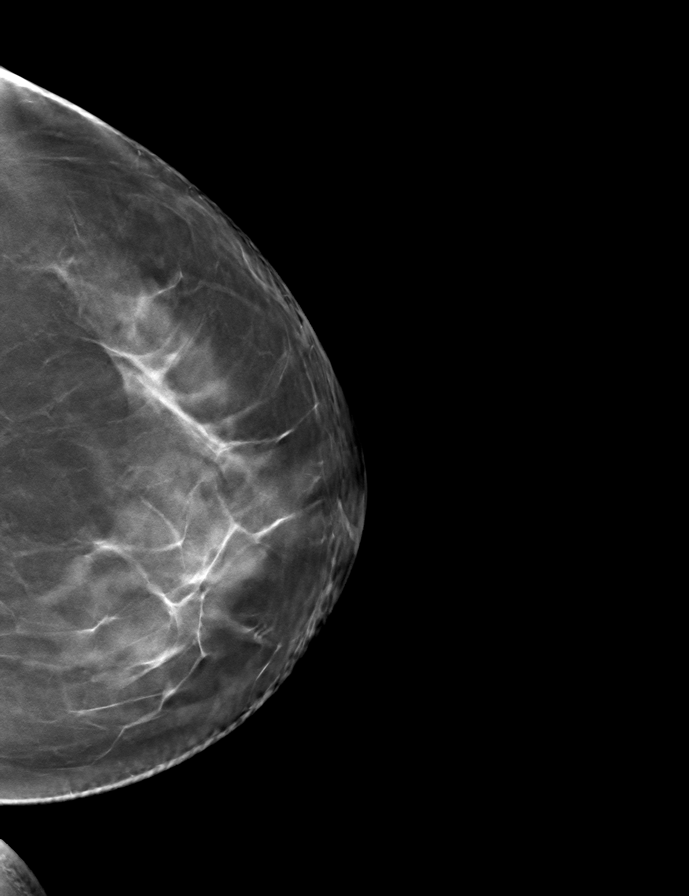

[R CC tomo · tomo slice 44/87.0]
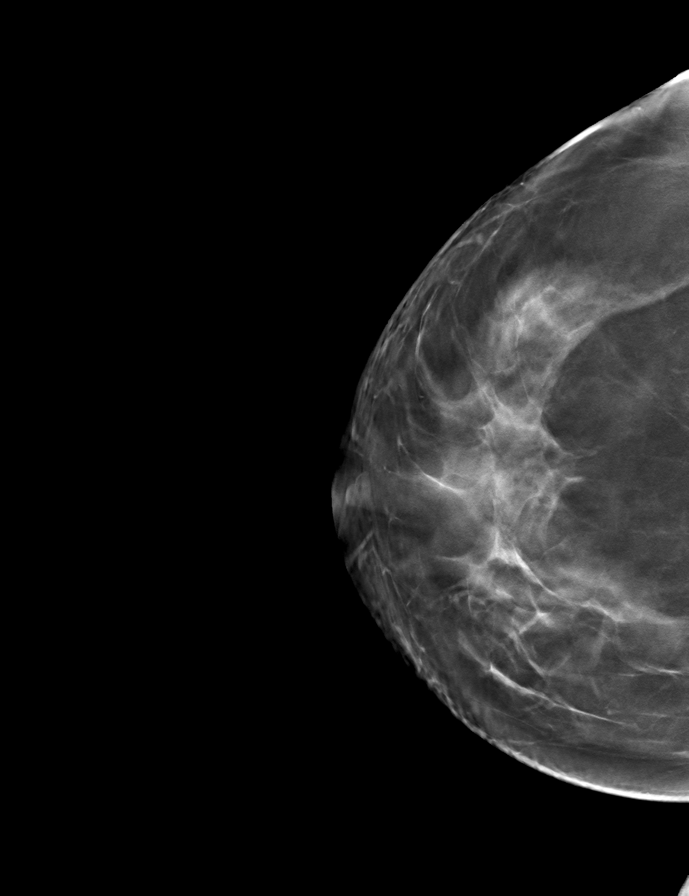

[L MLO tomo · tomo slice 43/85.0]
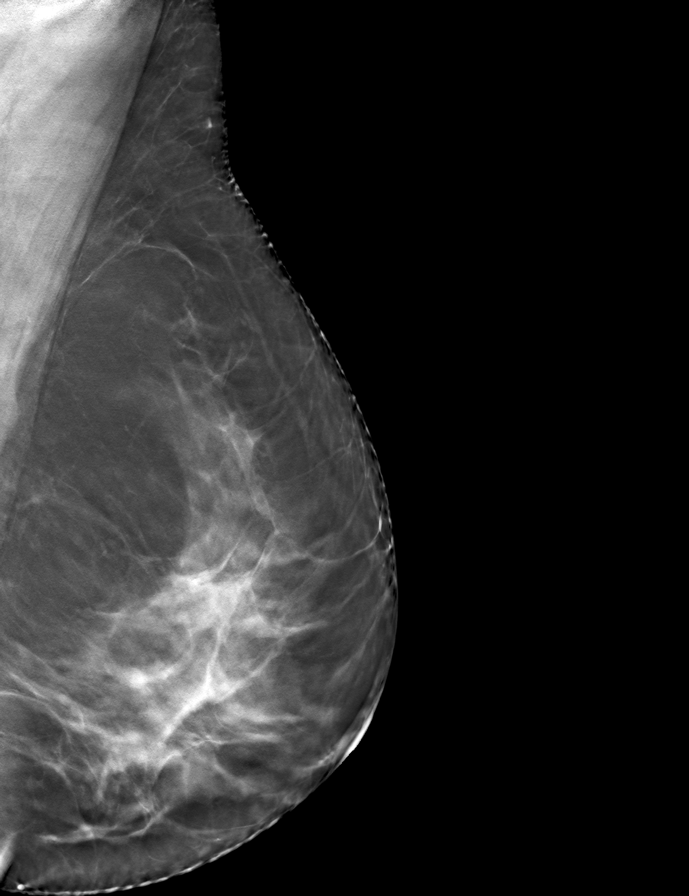

[R MLO tomo · tomo slice 42/83.0]
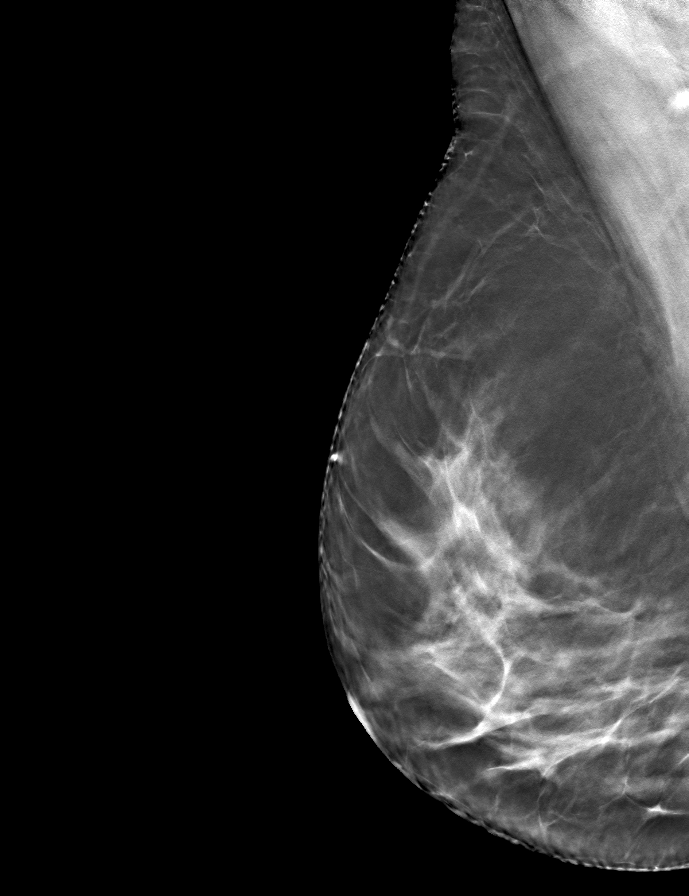

[9 of 24 positions shown; findings below may reference images not displayed]

ACR Breast Density Category c: The breast tissue is heterogeneously
dense, which may obscure small masses.
FINDINGS: There are no findings suspicious for malignancy.
IMPRESSION: No mammographic evidence of malignancy. A result letter of this
screening mammogram will be mailed directly to the patient.

RECOMMENDATION:
Screening mammogram at age 39. (Code:P4-T-W4U)

BI-RADS CATEGORY  1: Negative.

## 2023-05-14 ENCOUNTER — Other Ambulatory Visit: Payer: Self-pay | Admitting: Obstetrics & Gynecology

## 2023-05-25 ENCOUNTER — Encounter: Payer: Self-pay | Admitting: Obstetrics & Gynecology

## 2023-05-25 ENCOUNTER — Ambulatory Visit (INDEPENDENT_AMBULATORY_CARE_PROVIDER_SITE_OTHER): Payer: BC Managed Care – PPO | Admitting: Obstetrics & Gynecology

## 2023-05-25 VITALS — BP 138/91 | HR 138 | Ht 63.0 in | Wt 145.0 lb

## 2023-05-25 DIAGNOSIS — Z8759 Personal history of other complications of pregnancy, childbirth and the puerperium: Secondary | ICD-10-CM

## 2023-05-25 DIAGNOSIS — Z01419 Encounter for gynecological examination (general) (routine) without abnormal findings: Secondary | ICD-10-CM | POA: Diagnosis not present

## 2023-05-25 MED ORDER — ISIBLOOM 0.15-30 MG-MCG PO TABS: 1.0000 | ORAL_TABLET | Freq: Every day | ORAL | 12 refills | Status: DC

## 2023-05-25 NOTE — Progress Notes (Signed)
Subjective:     Maria [name] is a 32 y.o. female here for a routine exam.  Patient's last menstrual period was 05/17/2023 (exact date). G3P1001 Birth Control Method:  COC Menstrual Calendar(currently): regular  Current complaints: none.   Current acute medical issues:  none, history of GTN   Recent Gynecologic History Patient's last menstrual period was 05/17/2023 (exact date). 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   History of molar pregnancy    Medical history non-contributory    Patient desires pregnancy 04/15/2013   Pregnant 06/15/2015   Screening for depression 03/07/2016    Past Surgical History:  Procedure Laterality Date   DILATION AND EVACUATION N/A 07/10/2020   Procedure: SUCTION DILATATION AND EVACUATION;  Surgeon: Lazaro Arms, MD;  Location: [facility] OR;  Service: Gynecology;  Laterality: N/A;    OB History     Gravida  3   Para  1   Term  1   Preterm      AB      Living  1      SAB      IAB      Ectopic      Multiple  0   Live Births  1           Social History   Socioeconomic History   Marital status: Married    Spouse name: Not on file   Number of children: 1   Years of education: Not on file   Highest education level: Not on file  Occupational History   Occupation: home maker  Tobacco Use   Smoking status: Never   Smokeless tobacco: Never  Vaping Use   Vaping status: Never Used  Substance and Sexual Activity   Alcohol use: Not Currently    Comment: once, twice a year   Drug use: No   Sexual activity: Yes    Birth control/protection: Pill  Other Topics Concern   Not on file  Social History Narrative   Not on file   Social Determinants of Health   Financial Resource Strain: Low Risk  (05/25/2023)   Overall Financial Resource Strain  (CARDIA)    Difficulty of Paying Living Expenses: Not hard at all  Food Insecurity: No Food Insecurity (05/25/2023)   Hunger Vital Sign    Worried About Running Out of Food in the Last Year: Never true    Ran Out of Food in the Last Year: Never true  Transportation Needs: No Transportation Needs (05/25/2023)   PRAPARE - Administrator, Civil Service (Medical): No    Lack of Transportation (Non-Medical): No  Physical Activity: Insufficiently Active (05/25/2023)   Exercise Vital Sign    Days of Exercise per Week: 3 days    Minutes of Exercise per Session: 40 min  Stress: No Stress Concern Present (05/25/2023)   [employer] of Occupational Health - Occupational Stress Questionnaire    Feeling of Stress : Only a little  Social Connections: Socially Integrated (05/25/2023)   Social Connection and Isolation Panel [NHANES]    Frequency of Communication with Friends and Family: Three times a week    Frequency of Social Gatherings with Friends and Family: Twice a week    Attends Religious Services: More than 4 times per year    Active Member of Clubs or Organizations: Yes  Attends Banker Meetings: 1 to 4 times per year    Marital Status: Married    Family History  Problem Relation Age of Onset   Cancer Mother 59       breast   Asthma Neg Hx    Diabetes Neg Hx    Heart disease Neg Hx    Hypertension Neg Hx    Stroke Neg Hx    Colon cancer Neg Hx    Ovarian cancer Neg Hx    Endometrial cancer Neg Hx    Pancreatic cancer Neg Hx    Prostate cancer Neg Hx      Current Outpatient Medications:    desogestrel-ethinyl estradiol (ISIBLOOM) 0.15-30 MG-MCG tablet, Take 1 tablet by mouth daily., Disp: 28 tablet, Rfl: 12   Multiple Vitamin (MULTIVITAMIN PO), Take 1 tablet by mouth daily. (Patient not taking: Reported on 05/25/2023), Disp: , Rfl:   Review of Systems  Review of Systems  Constitutional: Negative for fever, chills, weight loss, malaise/fatigue and  diaphoresis.  HENT: Negative for hearing loss, ear pain, nosebleeds, congestion, sore throat, neck pain, tinnitus and ear discharge.   Eyes: Negative for blurred vision, double vision, photophobia, pain, discharge and redness.  Respiratory: Negative for cough, hemoptysis, sputum production, shortness of breath, wheezing and stridor.   Cardiovascular: Negative for chest pain, palpitations, orthopnea, claudication, leg swelling and PND.  Gastrointestinal: negative for abdominal pain. Negative for heartburn, nausea, vomiting, diarrhea, constipation, blood in stool and melena.  Genitourinary: Negative for dysuria, urgency, frequency, hematuria and flank pain.  Musculoskeletal: Negative for myalgias, back pain, joint pain and falls.  Skin: Negative for itching and rash.  Neurological: Negative for dizziness, tingling, tremors, sensory change, speech change, focal weakness, seizures, loss of consciousness, weakness and headaches.  Endo/Heme/Allergies: Negative for environmental allergies and polydipsia. Does not bruise/bleed easily.  Psychiatric/Behavioral: Negative for depression, suicidal ideas, hallucinations, memory loss and substance abuse. The patient is not nervous/anxious and does not have insomnia.        Objective:  Blood pressure (!) 152/88, pulse (!) 138, height 5\' 3"  (1.6 m), weight 145 lb (65.8 kg), last menstrual period 05/17/2023, not currently breastfeeding.   Physical Exam  Vitals reviewed. Constitutional: She is oriented to person, place, and time. She appears well-developed and well-nourished.  HENT:  Head: Normocephalic and atraumatic.        Right Ear: External ear normal.  Left Ear: External ear normal.  Nose: Nose normal.  Mouth/Throat: Oropharynx is clear and moist.  Eyes: Conjunctivae and EOM are normal. Pupils are equal, round, and reactive to light. Right eye exhibits no discharge. Left eye exhibits no discharge. No scleral icterus.  Neck: Normal range of motion. Neck  supple. No tracheal deviation present. No thyromegaly present.  Cardiovascular: Normal rate, regular rhythm, normal heart sounds and intact distal pulses.  Exam reveals no gallop and no friction rub.   No murmur heard. Respiratory: Effort normal and breath sounds normal. No respiratory distress. She has no wheezes. She has no rales. She exhibits no tenderness.  GI: Soft. Bowel sounds are normal. She exhibits no distension and no mass. There is no tenderness. There is no rebound and no guarding.  Genitourinary:  Breasts no masses skin changes or nipple changes bilaterally      Vulva is normal without lesions Vagina is pink moist without discharge Cervix normal in appearance and pap is not done Uterus is normal size shape and contour Adnexa is negative with normal sized ovaries   Musculoskeletal: Normal  range of motion. She exhibits no edema and no tenderness.  Neurological: She is alert and oriented to person, place, and time. She has normal reflexes. She displays normal reflexes. No cranial nerve deficit. She exhibits normal muscle tone. Coordination normal.  Skin: Skin is warm and dry. No rash noted. No erythema. No pallor.  Psychiatric: She has a normal mood and affect. Her behavior is normal. Judgment and thought content normal.       Medications Ordered at today's visit: Meds ordered this encounter  Medications   desogestrel-ethinyl estradiol (ISIBLOOM) 0.15-30 MG-MCG tablet    Sig: Take 1 tablet by mouth daily.    Dispense:  28 tablet    Refill:  12    Other orders placed at today's visit: No orders of the defined types were placed in this encounter.     Assessment:    Normal Gyn exam.   History of GTN, no evidence of recurrence Plan:    Contraception: OCP (estrogen/progesterone). Follow up in: 1 year.     Return in about 1 year (around 05/24/2024) for yearly.

## 2023-08-31 DIAGNOSIS — Z0000 Encounter for general adult medical examination without abnormal findings: Secondary | ICD-10-CM | POA: Diagnosis not present

## 2023-08-31 DIAGNOSIS — R635 Abnormal weight gain: Secondary | ICD-10-CM | POA: Diagnosis not present

## 2023-08-31 DIAGNOSIS — Z6828 Body mass index (BMI) 28.0-28.9, adult: Secondary | ICD-10-CM | POA: Diagnosis not present

## 2023-08-31 DIAGNOSIS — Z1331 Encounter for screening for depression: Secondary | ICD-10-CM | POA: Diagnosis not present

## 2023-08-31 DIAGNOSIS — Z0001 Encounter for general adult medical examination with abnormal findings: Secondary | ICD-10-CM | POA: Diagnosis not present

## 2023-08-31 DIAGNOSIS — E663 Overweight: Secondary | ICD-10-CM | POA: Diagnosis not present

## 2023-09-01 DIAGNOSIS — Z0001 Encounter for general adult medical examination with abnormal findings: Secondary | ICD-10-CM | POA: Diagnosis not present

## 2023-09-01 DIAGNOSIS — E039 Hypothyroidism, unspecified: Secondary | ICD-10-CM | POA: Diagnosis not present

## 2023-09-01 DIAGNOSIS — R635 Abnormal weight gain: Secondary | ICD-10-CM | POA: Diagnosis not present

## 2023-10-03 DIAGNOSIS — E0590 Thyrotoxicosis, unspecified without thyrotoxic crisis or storm: Secondary | ICD-10-CM | POA: Diagnosis not present

## 2023-10-16 DIAGNOSIS — E663 Overweight: Secondary | ICD-10-CM | POA: Diagnosis not present

## 2023-10-16 DIAGNOSIS — R21 Rash and other nonspecific skin eruption: Secondary | ICD-10-CM | POA: Diagnosis not present

## 2023-10-16 DIAGNOSIS — Z6827 Body mass index (BMI) 27.0-27.9, adult: Secondary | ICD-10-CM | POA: Diagnosis not present

## 2023-11-23 DIAGNOSIS — L508 Other urticaria: Secondary | ICD-10-CM | POA: Diagnosis not present

## 2024-05-30 ENCOUNTER — Ambulatory Visit (HOSPITAL_COMMUNITY)
Admission: RE | Admit: 2024-05-30 | Discharge: 2024-05-30 | Disposition: A | Discharge status: Home / Self Care | Source: Ambulatory Visit | Attending: Internal Medicine | Admitting: Internal Medicine

## 2024-05-30 ENCOUNTER — Other Ambulatory Visit (HOSPITAL_COMMUNITY): Payer: Self-pay | Admitting: Internal Medicine

## 2024-05-30 DIAGNOSIS — Z6827 Body mass index (BMI) 27.0-27.9, adult: Secondary | ICD-10-CM | POA: Diagnosis not present

## 2024-05-30 DIAGNOSIS — R102 Pelvic and perineal pain: Secondary | ICD-10-CM | POA: Diagnosis not present

## 2024-05-30 DIAGNOSIS — R319 Hematuria, unspecified: Secondary | ICD-10-CM | POA: Diagnosis not present

## 2024-06-05 DIAGNOSIS — R319 Hematuria, unspecified: Secondary | ICD-10-CM | POA: Diagnosis not present

## 2024-07-15 ENCOUNTER — Other Ambulatory Visit (HOSPITAL_COMMUNITY): Payer: Self-pay | Admitting: Obstetrics & Gynecology

## 2024-07-15 ENCOUNTER — Encounter: Payer: Self-pay | Admitting: Obstetrics & Gynecology

## 2024-07-15 ENCOUNTER — Ambulatory Visit (INDEPENDENT_AMBULATORY_CARE_PROVIDER_SITE_OTHER): Admitting: Obstetrics & Gynecology

## 2024-07-15 VITALS — BP 132/81 | HR 92 | Ht 63.0 in | Wt 147.0 lb

## 2024-07-15 DIAGNOSIS — Z1231 Encounter for screening mammogram for malignant neoplasm of breast: Secondary | ICD-10-CM

## 2024-07-15 DIAGNOSIS — Z01419 Encounter for gynecological examination (general) (routine) without abnormal findings: Secondary | ICD-10-CM

## 2024-07-15 DIAGNOSIS — Z8759 Personal history of other complications of pregnancy, childbirth and the puerperium: Secondary | ICD-10-CM | POA: Diagnosis not present

## 2024-07-15 MED ORDER — ISIBLOOM 0.15-30 MG-MCG PO TABS: 1.0000 | ORAL_TABLET | Freq: Every day | ORAL | 12 refills | Status: DC

## 2024-07-15 NOTE — Progress Notes (Signed)
 Subjective:     Maria [name] is a 33 y.o. female here for a routine exam.  No LMP recorded. (Menstrual status: Oral contraceptives). G3P1001 Birth Control Method:  COC Menstrual Calendar(currently): regular  Current complaints: none.   Current acute medical issues:  none   Recent Gynecologic History No LMP recorded. (Menstrual status: Oral contraceptives). 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   History of molar pregnancy    Medical history non-contributory    Patient desires pregnancy 04/15/2013   Pregnant 06/15/2015   Screening for depression 03/07/2016    Past Surgical History:  Procedure Laterality Date   DILATION AND EVACUATION N/A 07/10/2020   Procedure: SUCTION DILATATION AND EVACUATION;  Surgeon: Jayne Vonn DEL, MD;  Location: [facility] OR;  Service: Gynecology;  Laterality: N/A;    OB History     Gravida  3   Para  1   Term  1   Preterm      AB      Living  1      SAB      IAB      Ectopic      Multiple  0   Live Births  1           Social History   Socioeconomic History   Marital status: Married    Spouse name: Not on file   Number of children: 1   Years of education: Not on file   Highest education level: Not on file  Occupational History   Occupation: home maker  Tobacco Use   Smoking status: Never   Smokeless tobacco: Never  Vaping Use   Vaping status: Never Used  Substance and Sexual Activity   Alcohol use: Not Currently    Comment: once, twice a year   Drug use: No   Sexual activity: Yes    Birth control/protection: Pill  Other Topics Concern   Not on file  Social History Narrative   Not on file   Social Drivers of Health   Financial Resource Strain: Low Risk  (05/25/2023)   Overall Financial Resource Strain (CARDIA)    Difficulty of Paying Living  Expenses: Not hard at all  Food Insecurity: No Food Insecurity (05/25/2023)   Hunger Vital Sign    Worried About Running Out of Food in the Last Year: Never true    Ran Out of Food in the Last Year: Never true  Transportation Needs: No Transportation Needs (05/25/2023)   PRAPARE - Administrator, Civil Service (Medical): No    Lack of Transportation (Non-Medical): No  Physical Activity: Insufficiently Active (05/25/2023)   Exercise Vital Sign    Days of Exercise per Week: 3 days    Minutes of Exercise per Session: 40 min  Stress: No Stress Concern Present (05/25/2023)   [employer] of Occupational Health - Occupational Stress Questionnaire    Feeling of Stress : Only a little  Social Connections: Socially Integrated (05/25/2023)   Social Connection and Isolation Panel    Frequency of Communication with Friends and Family: Three times a week    Frequency of Social Gatherings with Friends and Family: Twice a week    Attends Religious Services: More than 4 times per year    Active Member of [employer] or Organizations: Yes    Attends Banker Meetings: 1  to 4 times per year    Marital Status: Married    Family History  Problem Relation Age of Onset   Cancer Mother 38       breast   Asthma Neg Hx    Diabetes Neg Hx    Heart disease Neg Hx    Hypertension Neg Hx    Stroke Neg Hx    Colon cancer Neg Hx    Ovarian cancer Neg Hx    Endometrial cancer Neg Hx    Pancreatic cancer Neg Hx    Prostate cancer Neg Hx      Current Outpatient Medications:    desogestrel -ethinyl estradiol  (ISIBLOOM ) 0.15-30 MG-MCG tablet, Take 1 tablet by mouth daily., Disp: 28 tablet, Rfl: 12   Multiple Vitamin (MULTIVITAMIN PO), Take 1 tablet by mouth daily. (Patient not taking: Reported on 07/15/2024), Disp: , Rfl:   Review of Systems  Review of Systems  Constitutional: Negative for fever, chills, weight loss, malaise/fatigue and diaphoresis.  HENT: Negative for hearing loss,  ear pain, nosebleeds, congestion, sore throat, neck pain, tinnitus and ear discharge.   Eyes: Negative for blurred vision, double vision, photophobia, pain, discharge and redness.  Respiratory: Negative for cough, hemoptysis, sputum production, shortness of breath, wheezing and stridor.   Cardiovascular: Negative for chest pain, palpitations, orthopnea, claudication, leg swelling and PND.  Gastrointestinal: negative for abdominal pain. Negative for heartburn, nausea, vomiting, diarrhea, constipation, blood in stool and melena.  Genitourinary: Negative for dysuria, urgency, frequency, hematuria and flank pain.  Musculoskeletal: Negative for myalgias, back pain, joint pain and falls.  Skin: Negative for itching and rash.  Neurological: Negative for dizziness, tingling, tremors, sensory change, speech change, focal weakness, seizures, loss of consciousness, weakness and headaches.  Endo/Heme/Allergies: Negative for environmental allergies and polydipsia. Does not bruise/bleed easily.  Psychiatric/Behavioral: Negative for depression, suicidal ideas, hallucinations, memory loss and substance abuse. The patient is not nervous/anxious and does not have insomnia.        Objective:  Blood pressure 132/81, pulse 92, height 5' 3 (1.6 m), weight 147 lb (66.7 kg).   Physical Exam  Vitals reviewed. Constitutional: She is oriented to person, place, and time. She appears well-developed and well-nourished.  HENT:  Head: Normocephalic and atraumatic.        Right Ear: External ear normal.  Left Ear: External ear normal.  Nose: Nose normal.  Mouth/Throat: Oropharynx is clear and moist.  Eyes: Conjunctivae and EOM are normal. Pupils are equal, round, and reactive to light. Right eye exhibits no discharge. Left eye exhibits no discharge. No scleral icterus.  Neck: Normal range of motion. Neck supple. No tracheal deviation present. No thyromegaly present.  Cardiovascular: Normal rate, regular rhythm, normal  heart sounds and intact distal pulses.  Exam reveals no gallop and no friction rub.   No murmur heard. Respiratory: Effort normal and breath sounds normal. No respiratory distress. She has no wheezes. She has no rales. She exhibits no tenderness.  GI: Soft. Bowel sounds are normal. She exhibits no distension and no mass. There is no tenderness. There is no rebound and no guarding.  Genitourinary:  Breasts no masses skin changes or nipple changes bilaterally      Vulva is normal without lesions Vagina is pink moist without discharge Cervix normal in appearance and pap is not done Uterus is normal size shape and contour Adnexa is negative with normal sized ovaries   Musculoskeletal: Normal range of motion. She exhibits no edema and no tenderness.  Neurological: She is alert  and oriented to person, place, and time. She has normal reflexes. She displays normal reflexes. No cranial nerve deficit. She exhibits normal muscle tone. Coordination normal.  Skin: Skin is warm and dry. No rash noted. No erythema. No pallor.  Psychiatric: She has a normal mood and affect. Her behavior is normal. Judgment and thought content normal.       Medications Ordered at today's visit: Meds ordered this encounter  Medications   desogestrel -ethinyl estradiol  (ISIBLOOM ) 0.15-30 MG-MCG tablet    Sig: Take 1 tablet by mouth daily.    Dispense:  28 tablet    Refill:  12    Other orders placed at today's visit: No orders of the defined types were placed in this encounter.    ASSESSMENT + PLAN:    ICD-10-CM   1. Well woman exam with routine gynecological exam  Z01.419     2. History of non metastatic gestational trophoblastic nepoplasia, 09/2020  Z87.59           Return in about 1 year (around 07/15/2025) for Follow up, with Dr Jayne.

## 2024-08-02 DIAGNOSIS — Z23 Encounter for immunization: Secondary | ICD-10-CM | POA: Diagnosis not present

## 2024-08-03 ENCOUNTER — Other Ambulatory Visit: Payer: Self-pay | Admitting: Obstetrics & Gynecology

## 2024-08-12 ENCOUNTER — Ambulatory Visit (HOSPITAL_COMMUNITY)
Admission: RE | Admit: 2024-08-12 | Discharge: 2024-08-12 | Disposition: A | Discharge status: Home / Self Care | Source: Ambulatory Visit | Attending: Obstetrics & Gynecology | Admitting: Obstetrics & Gynecology

## 2024-08-12 ENCOUNTER — Encounter (HOSPITAL_COMMUNITY): Payer: Self-pay

## 2024-08-12 DIAGNOSIS — Z1231 Encounter for screening mammogram for malignant neoplasm of breast: Secondary | ICD-10-CM | POA: Diagnosis not present

## 2024-08-14 ENCOUNTER — Ambulatory Visit (HOSPITAL_COMMUNITY): Payer: Self-pay | Admitting: Obstetrics & Gynecology
